# Patient Record
Sex: Male | Born: 1955 | Race: White | Hispanic: No | Marital: Married | Smoking: Never smoker
Health system: Southern US, Community
[De-identification: ages and names within clinical notes are randomized; demographics above are authoritative.]

## PROBLEM LIST (undated history)

## (undated) DIAGNOSIS — Z860100 Personal history of colon polyps, unspecified: Secondary | ICD-10-CM

## (undated) DIAGNOSIS — M1990 Unspecified osteoarthritis, unspecified site: Secondary | ICD-10-CM

## (undated) DIAGNOSIS — R0981 Nasal congestion: Secondary | ICD-10-CM

## (undated) DIAGNOSIS — IMO0002 Reserved for concepts with insufficient information to code with codable children: Secondary | ICD-10-CM

## (undated) DIAGNOSIS — M545 Low back pain, unspecified: Secondary | ICD-10-CM

## (undated) DIAGNOSIS — Z85828 Personal history of other malignant neoplasm of skin: Secondary | ICD-10-CM

## (undated) DIAGNOSIS — K5730 Diverticulosis of large intestine without perforation or abscess without bleeding: Secondary | ICD-10-CM

## (undated) DIAGNOSIS — C801 Malignant (primary) neoplasm, unspecified: Secondary | ICD-10-CM

## (undated) DIAGNOSIS — E785 Hyperlipidemia, unspecified: Secondary | ICD-10-CM

## (undated) DIAGNOSIS — Z8701 Personal history of pneumonia (recurrent): Secondary | ICD-10-CM

## (undated) HISTORY — DX: Personal history of colonic polyps: Z86.010

## (undated) HISTORY — DX: Low back pain: M54.5

## (undated) HISTORY — DX: Personal history of pneumonia (recurrent): Z87.01

## (undated) HISTORY — PX: COLONOSCOPY: SHX174

## (undated) HISTORY — PX: OTHER SURGICAL HISTORY: SHX169

## (undated) HISTORY — DX: Hyperlipidemia, unspecified: E78.5

## (undated) HISTORY — DX: Malignant (primary) neoplasm, unspecified: C80.1

## (undated) HISTORY — DX: Unspecified osteoarthritis, unspecified site: M19.90

## (undated) HISTORY — DX: Nasal congestion: R09.81

## (undated) HISTORY — DX: Personal history of other malignant neoplasm of skin: Z85.828

## (undated) HISTORY — PX: CYST EXCISION: SHX5701

## (undated) HISTORY — DX: Low back pain, unspecified: M54.50

## (undated) HISTORY — DX: Personal history of colon polyps, unspecified: Z86.0100

## (undated) HISTORY — DX: Reserved for concepts with insufficient information to code with codable children: IMO0002

## (undated) HISTORY — DX: Diverticulosis of large intestine without perforation or abscess without bleeding: K57.30

---

## 2004-02-06 ENCOUNTER — Ambulatory Visit: Payer: Self-pay | Admitting: Pulmonary Disease

## 2004-02-20 ENCOUNTER — Ambulatory Visit: Payer: Self-pay | Admitting: Pulmonary Disease

## 2005-05-23 ENCOUNTER — Ambulatory Visit: Payer: Self-pay | Admitting: Pulmonary Disease

## 2005-10-21 ENCOUNTER — Ambulatory Visit: Payer: Self-pay | Admitting: Pulmonary Disease

## 2005-10-21 LAB — CONVERTED CEMR LAB
Cholesterol: 218 mg/dL (ref 0–200)
HDL: 30.9 mg/dL — ABNORMAL LOW (ref >39.0)
LDL DIRECT: 167.1 mg/dL
Triglyceride fasting, serum: 117 mg/dL (ref 0–149)

## 2006-06-21 ENCOUNTER — Ambulatory Visit: Payer: Self-pay | Admitting: Pulmonary Disease

## 2006-11-22 ENCOUNTER — Ambulatory Visit: Payer: Self-pay | Admitting: Pulmonary Disease

## 2006-11-22 LAB — CONVERTED CEMR LAB
Albumin: 4.2 g/dL (ref 3.5–5.2)
Alkaline Phosphatase: 51 units/L (ref 39–117)
LDL Cholesterol: 99 mg/dL (ref 0–99)
Total CHOL/HDL Ratio: 4.8
Total Protein: 7.4 g/dL (ref 6.0–8.3)

## 2006-12-21 ENCOUNTER — Telehealth (INDEPENDENT_AMBULATORY_CARE_PROVIDER_SITE_OTHER): Payer: Self-pay | Admitting: *Deleted

## 2007-01-02 ENCOUNTER — Encounter: Payer: Self-pay | Admitting: Pulmonary Disease

## 2007-09-19 ENCOUNTER — Telehealth (INDEPENDENT_AMBULATORY_CARE_PROVIDER_SITE_OTHER): Payer: Self-pay | Admitting: *Deleted

## 2007-11-21 ENCOUNTER — Ambulatory Visit: Payer: Self-pay | Admitting: Pulmonary Disease

## 2007-11-21 DIAGNOSIS — E7800 Pure hypercholesterolemia, unspecified: Secondary | ICD-10-CM

## 2007-11-21 DIAGNOSIS — J189 Pneumonia, unspecified organism: Secondary | ICD-10-CM

## 2007-11-21 DIAGNOSIS — M545 Low back pain: Secondary | ICD-10-CM

## 2007-11-21 DIAGNOSIS — M1990 Unspecified osteoarthritis, unspecified site: Secondary | ICD-10-CM | POA: Insufficient documentation

## 2007-11-21 DIAGNOSIS — Z85828 Personal history of other malignant neoplasm of skin: Secondary | ICD-10-CM | POA: Insufficient documentation

## 2007-11-22 ENCOUNTER — Ambulatory Visit: Payer: Self-pay | Admitting: Pulmonary Disease

## 2007-11-23 LAB — CONVERTED CEMR LAB
ALT: 35 units/L (ref 0–53)
Albumin: 4.1 g/dL (ref 3.5–5.2)
BUN: 14 mg/dL (ref 6–23)
Basophils Relative: 0.4 % (ref 0.0–3.0)
Bilirubin, Direct: 0.1 mg/dL (ref 0.0–0.3)
CO2: 31 meq/L (ref 19–32)
Calcium: 9.3 mg/dL (ref 8.4–10.5)
Eosinophils Relative: 4.3 % (ref 0.0–5.0)
GFR calc Af Amer: 132 mL/min
Glucose, Bld: 102 mg/dL — ABNORMAL HIGH (ref 70–99)
HDL: 31.8 mg/dL — ABNORMAL LOW (ref >39.0)
Hemoglobin: 15.6 g/dL (ref 13.0–17.0)
LDL Cholesterol: 97 mg/dL (ref 0–99)
Lymphocytes Relative: 27.7 % (ref 12.0–46.0)
Monocytes Absolute: 0.6 10*3/uL (ref 0.1–1.0)
Monocytes Relative: 7.8 % (ref 3.0–12.0)
Neutro Abs: 4.4 10*3/uL (ref 1.4–7.7)
Total CHOL/HDL Ratio: 4.6
Total Protein: 6.9 g/dL (ref 6.0–8.3)
Triglycerides: 91 mg/dL (ref 0–149)

## 2008-10-08 ENCOUNTER — Telehealth: Payer: Self-pay | Admitting: Pulmonary Disease

## 2008-10-21 ENCOUNTER — Ambulatory Visit: Payer: Self-pay | Admitting: Gastroenterology

## 2008-11-12 ENCOUNTER — Ambulatory Visit: Payer: Self-pay | Admitting: Gastroenterology

## 2008-11-12 ENCOUNTER — Encounter: Payer: Self-pay | Admitting: Gastroenterology

## 2008-11-12 ENCOUNTER — Encounter: Payer: Self-pay | Admitting: Pulmonary Disease

## 2008-11-14 ENCOUNTER — Encounter: Payer: Self-pay | Admitting: Gastroenterology

## 2009-01-16 ENCOUNTER — Ambulatory Visit: Payer: Self-pay | Admitting: Pulmonary Disease

## 2009-01-16 DIAGNOSIS — D179 Benign lipomatous neoplasm, unspecified: Secondary | ICD-10-CM | POA: Insufficient documentation

## 2009-01-16 LAB — CONVERTED CEMR LAB
ALT: 43 units/L (ref 0–53)
AST: 29 units/L (ref 0–37)
Alkaline Phosphatase: 58 units/L (ref 39–117)
BUN: 9 mg/dL (ref 6–23)
Basophils Relative: 1.1 % (ref 0.0–3.0)
Calcium: 9.6 mg/dL (ref 8.4–10.5)
Cholesterol: 209 mg/dL — ABNORMAL HIGH (ref 0–200)
Direct LDL: 151.4 mg/dL
Eosinophils Absolute: 0.3 10*3/uL (ref 0.0–0.7)
Eosinophils Relative: 5.2 % — ABNORMAL HIGH (ref 0.0–5.0)
GFR calc non Af Amer: 108.28 mL/min (ref >60)
Glucose, Bld: 100 mg/dL — ABNORMAL HIGH (ref 70–99)
HDL: 41.6 mg/dL (ref >39.00)
Lymphocytes Relative: 31.1 % (ref 12.0–46.0)
MCHC: 33.1 g/dL (ref 30.0–36.0)
Monocytes Relative: 10.2 % (ref 3.0–12.0)
Neutro Abs: 3.1 10*3/uL (ref 1.4–7.7)
Neutrophils Relative %: 52.4 % (ref 43.0–77.0)
Potassium: 4.2 meq/L (ref 3.5–5.1)
Sed Rate: 8 mm/hr (ref 0–22)
Total Protein: 7.1 g/dL (ref 6.0–8.3)
VLDL: 22.4 mg/dL (ref 0.0–40.0)

## 2009-01-21 ENCOUNTER — Encounter: Payer: Self-pay | Admitting: Pulmonary Disease

## 2009-01-21 ENCOUNTER — Encounter: Admission: RE | Admit: 2009-01-21 | Discharge: 2009-01-21 | Payer: Self-pay | Admitting: General Surgery

## 2009-01-28 ENCOUNTER — Encounter: Payer: Self-pay | Admitting: Pulmonary Disease

## 2009-02-02 ENCOUNTER — Telehealth: Payer: Self-pay | Admitting: Pulmonary Disease

## 2009-05-25 ENCOUNTER — Telehealth: Payer: Self-pay | Admitting: Pulmonary Disease

## 2009-08-28 ENCOUNTER — Telehealth (INDEPENDENT_AMBULATORY_CARE_PROVIDER_SITE_OTHER): Payer: Self-pay | Admitting: *Deleted

## 2009-11-09 ENCOUNTER — Ambulatory Visit: Payer: Self-pay | Admitting: Pulmonary Disease

## 2009-11-09 ENCOUNTER — Telehealth: Payer: Self-pay | Admitting: Pulmonary Disease

## 2009-11-14 DIAGNOSIS — D126 Benign neoplasm of colon, unspecified: Secondary | ICD-10-CM | POA: Insufficient documentation

## 2009-11-14 DIAGNOSIS — K5730 Diverticulosis of large intestine without perforation or abscess without bleeding: Secondary | ICD-10-CM | POA: Insufficient documentation

## 2010-01-22 IMAGING — CR DG FEMUR 2V*L*
4 series · 4 of 4 positions shown · non-contrast
Comparison: None

CLINICAL DATA: Palpable mass left upper thigh.

LEFT FEMUR - 2 VIEW

[view not recorded (1 of 4)]
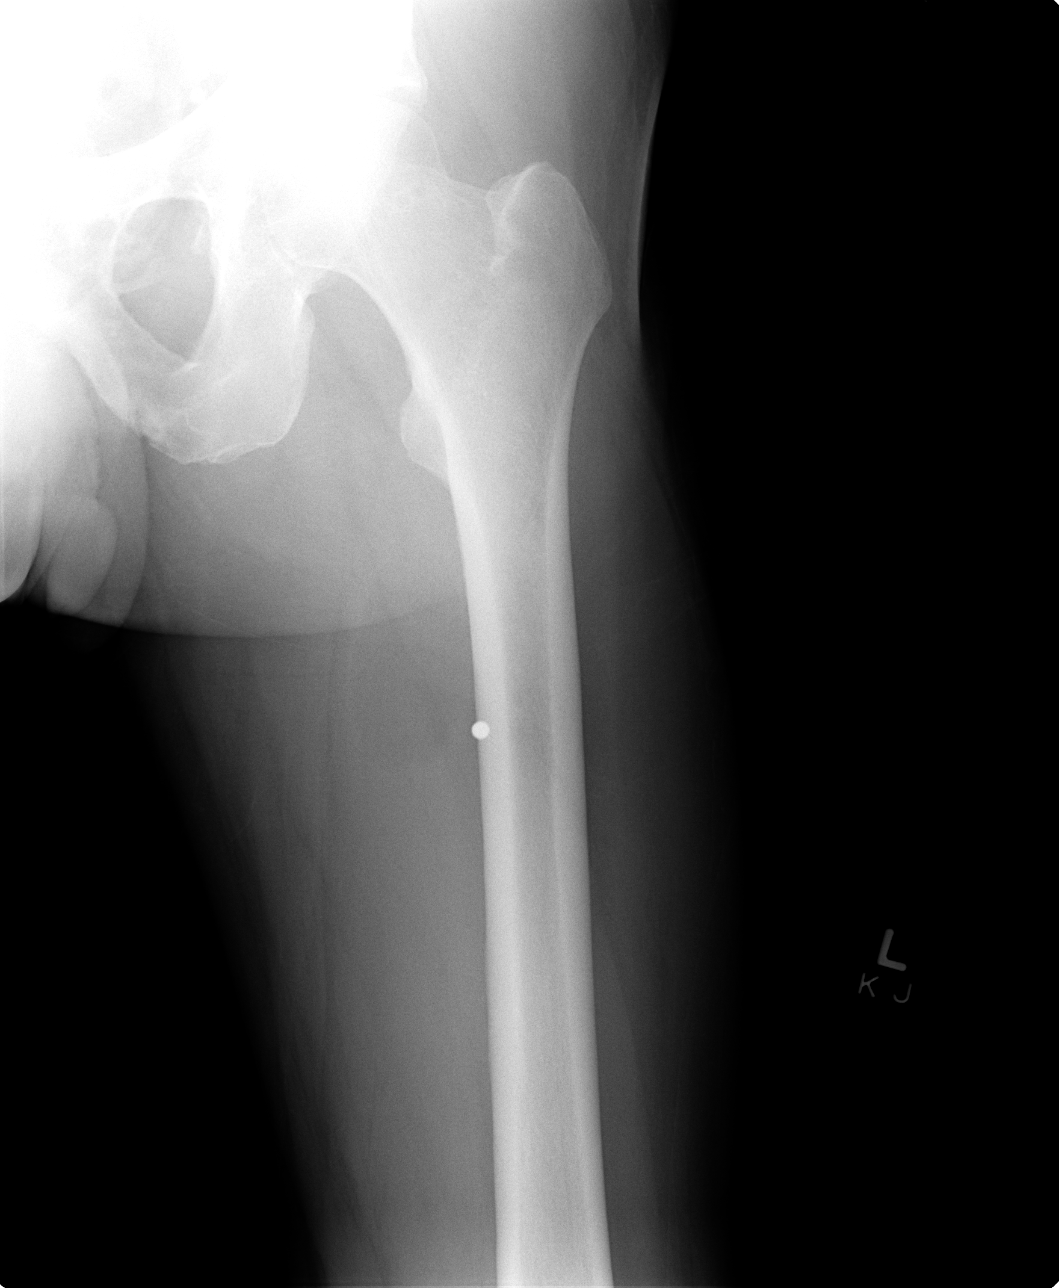

[view not recorded (2 of 4)]
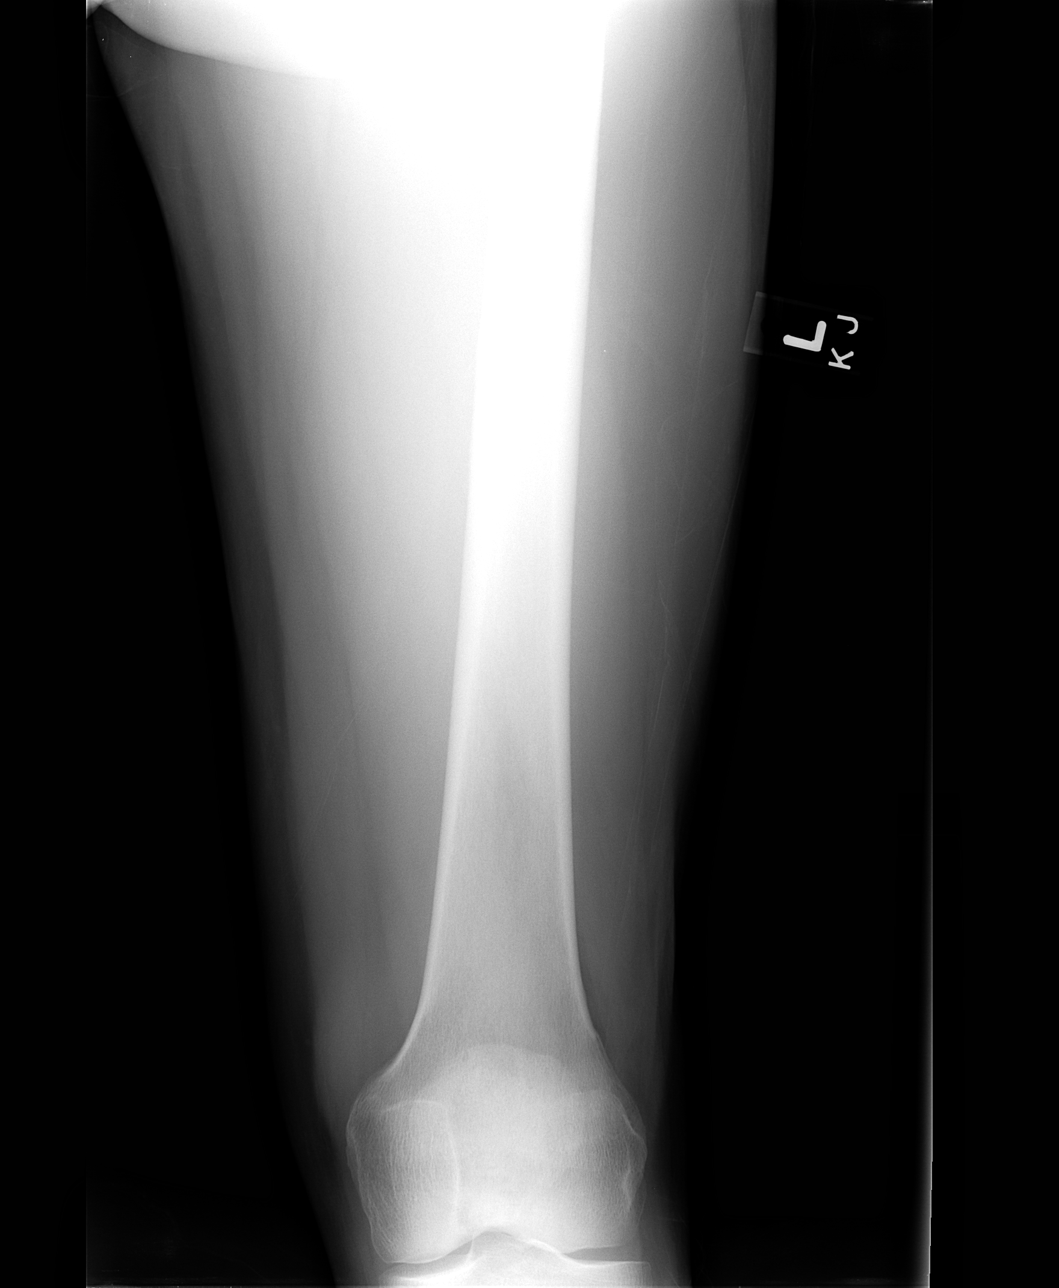

[view not recorded (3 of 4)]
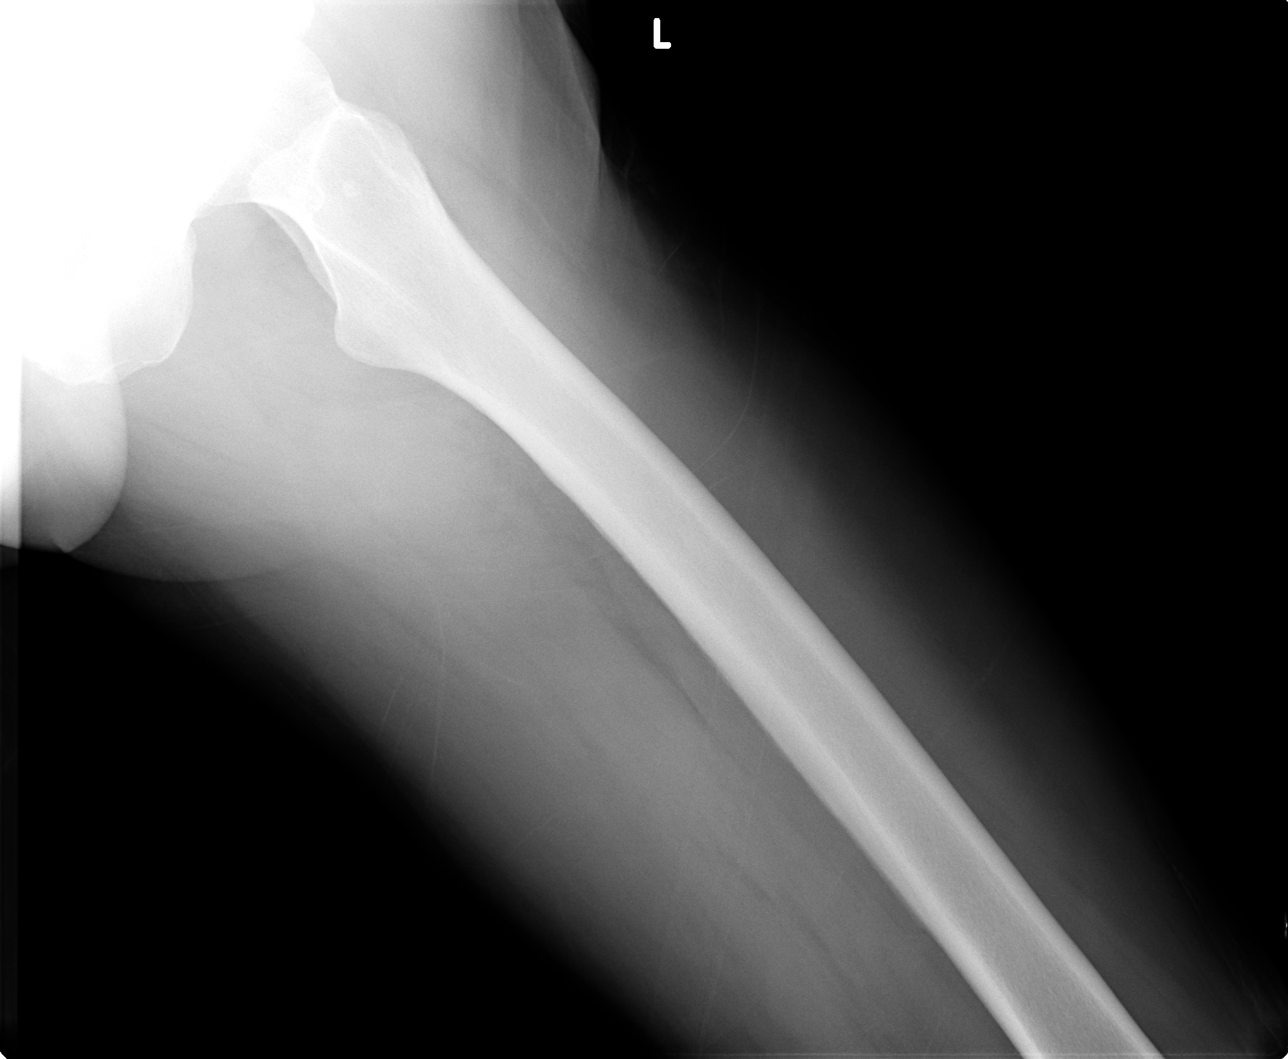

[view not recorded (4 of 4)]
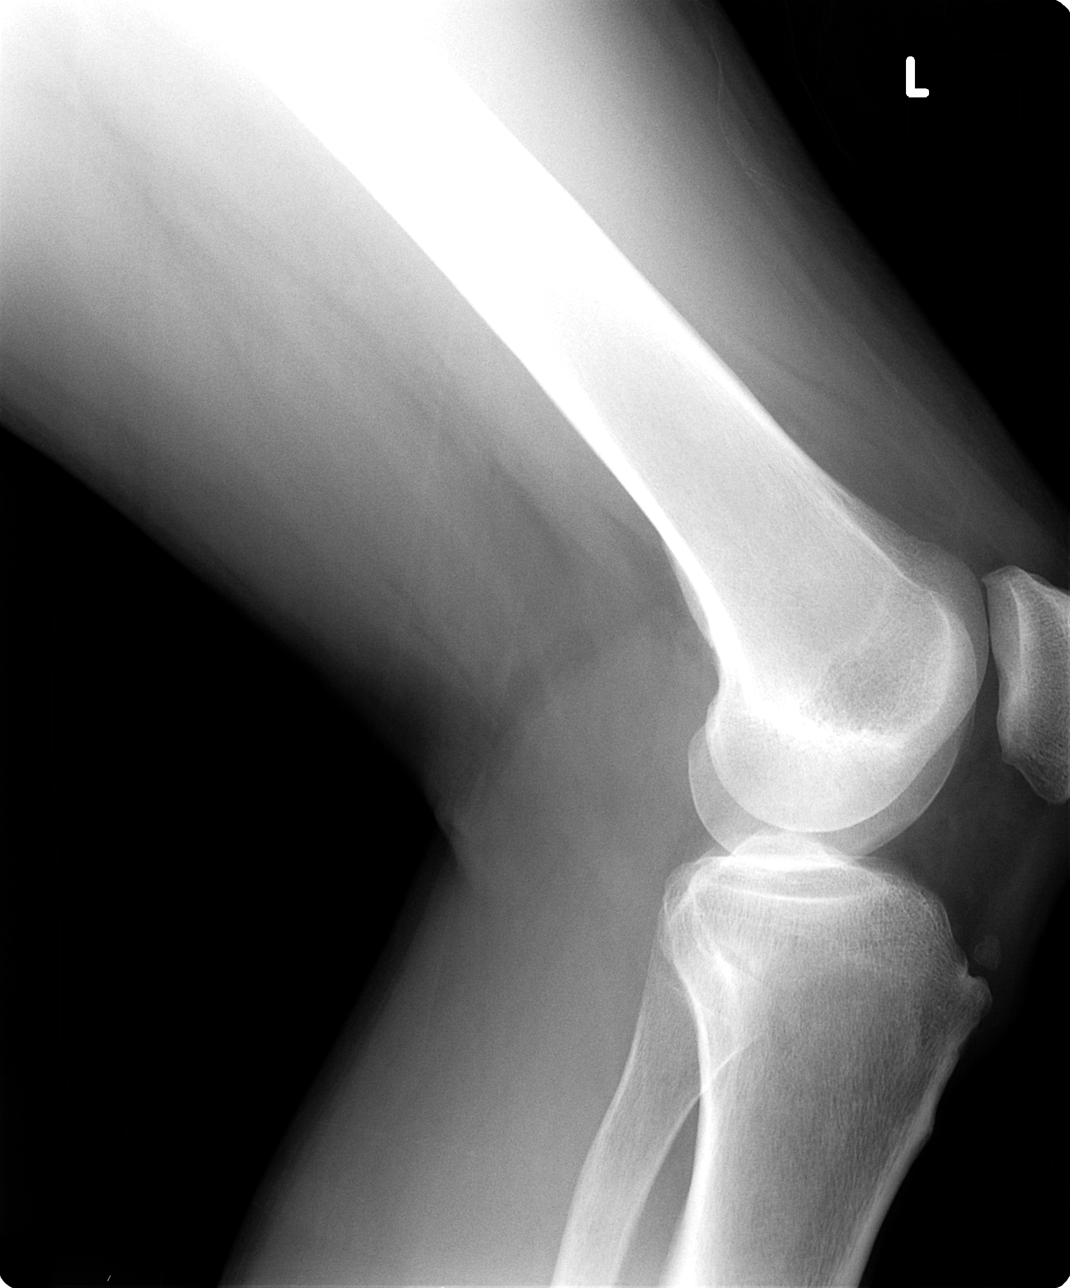

[4 of 4 positions shown; findings below may reference images not displayed]

FINDINGS: A BB was placed in the area of palpable soft tissue
abnormality.  No abnormality seen.  Bony structures unremarkable.
Soft tissues appear unremarkable by plain radiographs.
IMPRESSION: No acute bony abnormality.

## 2010-02-09 NOTE — Progress Notes (Signed)
Summary: prescript  Phone Note Call from Patient Call back at [phone]   Caller: Patient Call For: nadel Summary of Call: pt need 3 month supply for cholestrol med sent to [facility] Initial call taken by: Rickard Patience,  August 28, 2009 2:17 PM  Follow-up for Phone Call        Pt is scheduling appt with SN and is aware that refills are through [facility]-filled back in May 2011.Reynaldo Minium CMA  August 28, 2009 2:27 PM

## 2010-02-09 NOTE — Assessment & Plan Note (Signed)
Summary: rov per Katie/apc   CC:  2 year ROV & review of mult medical problems> requests 90d refills....  History of Present Illness: 55 y/o WM here for a follow up visit...  he has multiple medical problems as noted below...     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 he had routine colonoscopy 11/10 by DrJacobs w/ mild divertics & tubular adenoma removed- f/u planned 56yrs...  no new complaints or concerns, & he just wants his Simvastatin refilled today.   Current Problem List:  PHYSICAL EXAMINATION (ICD-V70.0) / Health Maintenance - good general health... 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  Hx of PNEUMONIA (ICD-486) - yrs ago, no known residual problems...  HYPERCHOLESTEROLEMIA (ICD-272.0) - on SIMVASTATIN 40mg /d,  Fish Oil, + diet...  ~  FLP 4/07 showed TChol 248, TG 132, HDL 48, LDL 174... rec to start Simva40...  ~  FLP 5/08 on Simva40 irregularly showed TChol 225, TG 206, HDL 43, LDL 141... rec- take daily...  ~  FLP 11/09 on Simva40 showed TChol 147, TG 91, HDL 32, LDL 97  ~  FLP 1/11 on Simva40 showed TChol 209, TG 112, HDL 42, LDL 151... reminded to take daily.  DIVERTICULOSIS OF COLON (ICD-562.10) COLONIC POLYPS (ICD-211.3) - routine colonosopy 11/10 by DrJacobs showed mild divertics & 2 polyps= tubular ademoma, f/u planned 80yrs...  DEGENERATIVE JOINT DISEASE (ICD-715.90) - hx prev knee arthroscopy...  Hx of BACK PAIN, LUMBAR (ICD-724.2) - he states "spurs" on prev eval...  SKIN CANCER, HX OF (ICD-V10.83) - he sees Dr Donzetta Starch annually for eval> superfic basal cell cancers removed from chest & leg 6/11...  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   Preventive  Screening-Counseling & Management  Alcohol-Tobacco     Smoking Status: never  Allergies (verified): No Known Drug Allergies  Comments:  Nurse/Medical Assistant: The patient's medications and allergies were reviewed with the patient and were updated in the Medication and Allergy Lists.  Past History:  Past Medical History: Hx of PNEUMONIA (ICD-486) HYPERCHOLESTEROLEMIA (ICD-272.0) DIVERTICULOSIS OF COLON (ICD-562.10) COLONIC POLYPS (ICD-211.3) DEGENERATIVE JOINT DISEASE (ICD-715.90) Hx of BACK PAIN, LUMBAR (ICD-724.2) SKIN CANCER, HX OF (ICD-V10.83) LIPOMA (ICD-214.9)  Past Surgical History: S/P knee arthroscopy  Family History: Reviewed history from 11/21/2007 and no changes required. Father alive age 82 w/ hx CAD and CABG Mother died age 75 w/ alcoholism & liver cancer 2 Siblings- 1 Bro w/ HBP, Chol;  ! Bro in good health  Social History: Reviewed history from 11/21/2007 and no changes required. 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    Vital Signs:  Patient profile:   55 year old male Height:      71 inches Weight:      187.50 pounds BMI:     26.25 O2 Sat:      97 % on Room air Temp:  97.9 degrees F oral Pulse rate:   72 / minute BP sitting:   132 / 70  (left arm) Cuff size:   regular  Vitals Entered By: Randell Loop CMA (November 09, 2009 2:11 PM)  O2 Sat at Rest %:  97 O2 Flow:  Room air CC: 2 year ROV & review of mult medical problems> requests 90d  refills... Is Patient Diabetic? No Pain Assessment Patient in pain? no      Comments meds updated today with pt   Physical Exam  Additional Exam:  WD, WN, 55 y/o WM in NAD... GENERAL:  Alert & oriented; pleasant & cooperative... HEENT:  [place]/AT, EOM-wnl, PERRLA, Fundi-benign, EACs-clear, TMs-wnl, NOSE-clear, THROAT-clear & wnl. NECK:  Supple w/ full ROM; no JVD; normal carotid impulses w/o bruits; no thyromegaly or nodules palpated; no lymphadenopathy. CHEST:  Clear to P & A; without wheezes/ rales/ or rhonchi. HEART:  Regular Rhythm; without murmurs/ rubs/ or gallops. ABDOMEN:  Soft & nontender; normal bowel sounds; no organomegaly or masses detected., groin no adenopathy, no bruits noted.  femoral pulses intact.  EXT: without deformities or arthritic changes; no varicose veins/ venous insuffic/ or edema. left mid to upper thigh w/ soft tissue mass  ~3 cm sl firm and nontender.  NEURO:  CN's intact; motor testing normal; sensory testing normal; gait normal & balance OK. DERM:  No lesions noted; no rash etc...    Impression & Recommendations:  Problem # 1:  HYPERCHOLESTEROLEMIA (ICD-272.0) Stable on Simva40... reminded to take daily & low chol diet... His updated medication list for this problem includes:    Simvastatin 40 Mg Tabs (Simvastatin) .Marland Kitchen... Take 1 by mouth once daily...  Problem # 2:  DEGENERATIVE JOINT DISEASE (ICD-715.90) No complaints>  he had prev left knee arthroscopy. His updated medication list for this problem includes:    Aspirin 81 Mg Tbec (Aspirin) .Marland Kitchen... Take 1 tab by mouth once daily...  Problem # 3:  COLONIC POLYPS (ICD-211.3) Routine colon w/ mild divertics and polyp> f/u planned 27yrs...  Problem # 4:  LIPOMA (ICD-214.9) He is planning excisional surg later when insurance covers...  Complete Medication List: 1)  Aspirin 81 Mg Tbec (Aspirin) .... Take 1 tab by mouth once daily.Marland KitchenMarland Kitchen 2)  Simvastatin 40 Mg Tabs (Simvastatin) .... Take 1 by mouth once  daily...  Patient Instructions: 1)  Today we updated your med list- see below.... 2)  We refilled your Simvstatin for a 90d supply as requested... 3)  Call for any problems.Marland KitchenMarland Kitchen 4)  Please schedule a follow-up appointment in 1 year, sooner as needed. Prescriptions: SIMVASTATIN 40 MG  TABS (SIMVASTATIN) take 1 by mouth once daily...  #90 x 4   Entered and Authorized by:   Michele Mcalpine MD   Signed by:   Michele Mcalpine MD on 11/09/2009   Method used:   Print then Give to Patient   RxID:   [id number]

## 2010-02-09 NOTE — Progress Notes (Signed)
Summary: med refills  Phone Note Call from Patient   Caller: Patient Call For: Seyed Heffley Summary of Call: pt has questions re: new ins- [employer] and having meds refilled through [employer]. pt # [id number] Initial call taken by: Tivis Ringer, CNA,  May 25, 2009 9:56 AM  Follow-up for Phone Call        pt has switched insurance companies and now has to use [facility] for rx. pt request refill for simvastatin be sent to [facility]. rx sent. ptaware. Carron Curie CMA  May 25, 2009 10:08 AM     Prescriptions: SIMVASTATIN 40 MG  TABS (SIMVASTATIN) take 1 by mouth once daily...  #90 x 3   Entered by:   Carron Curie CMA   Authorized by:   Michele Mcalpine MD   Signed by:   Carron Curie CMA on 05/25/2009   Method used:   Faxed to ...       [facility]* (mail-order)             ,          Ph: [phone]       Fax: [phone]   RxID:   [id number]

## 2010-02-09 NOTE — Letter (Signed)
Summary: [facility]   Imported By: Sherian Rein 02/28/2009 08:25:57  _____________________________________________________________________  External Attachment:    Type:   Image     Comment:   External Document

## 2010-02-09 NOTE — Consult Note (Signed)
Summary: [facility]   Imported By: Sherian Rein 02/20/2009 12:51:34  _____________________________________________________________________  External Attachment:    Type:   Image     Comment:   External Document

## 2010-02-09 NOTE — Assessment & Plan Note (Signed)
Summary: Acute NP office visit - knot on thigh   CC:  knot on top of left thigh toward the groin area approx the size of a silver dollar x58months - denies redness/pain/increase in size.  History of Present Illness: 55  y/o WM with known history of Hyperlipidemia and DJD  January 16, 2009--Presents for knot on top of left thigh toward the groin area approx the size of a silver dollar x65months - denies redness/pain/increase in size or known injury. this is not painful, no redness. Does not think this has grown in size. His wife is concerned this has not resolved. Also dry nonpruritic rash along back of legs this started 1 month ago. Has had in past used cream for dry skin. Worse than usual. Denies chest pain, dyspnea, orthopnea, hemoptysis, fever, n/v/d, edema, headache.   Medications Prior to Update: 1)  Aspirin 81 Mg Tbec (Aspirin) .... Take 1 Tab By Mouth Once Daily.Marland KitchenMarland Kitchen 2)  Simvastatin 40 Mg  Tabs (Simvastatin) .... Take 1 By Mouth Once Daily...  Current Medications (verified): 1)  Aspirin 81 Mg Tbec (Aspirin) .... Take 1 Tab By Mouth Once Daily.Marland KitchenMarland Kitchen 2)  Simvastatin 40 Mg  Tabs (Simvastatin) .... Take 1 By Mouth Once Daily...  Allergies (verified): No Known Drug Allergies  Past History:  Past Surgical History: Last updated: 11/21/2007 S/P knee arthroscopy  Family History: Last updated: 11/21/2007 Father alive age 32 w/ hx CAD and CABG Mother died age 67 w/ alcoholism & liver cancer 2 Siblings- 1 Bro w/ HBP, Chol;  ! 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  Hx of PNEUMONIA (ICD-486) - yrs ago, no known residual problems...  HYPERCHOLESTEROLEMIA (ICD-272.0) - on SIMVASTATIN 40mg /d,  Fish Oil, + diet...  ~  FLP 4/07 showed TChol 248, TG 132, HDL 48, LDL 174... rec to start Simva40...  ~  FLP 5/08 on Simva irregularly showed TChol 225, TG 206, HDL 43, LDL 141... rec- take daily...    DEGENERATIVE JOINT DISEASE (ICD-715.90) - hx prev knee arthroscopy...  Hx of BACK PAIN, LUMBAR (ICD-724.2) - he states "spurs" on prev eval...  SKIN CANCER, HX OF (ICD-V10.83) - he sees dr Donzetta Starch annually for eval... several pre-cancerous lesions removed...     Review of Systems      See HPI  Vital Signs:  Patient profile:   55 year old male Height:      71 inches Weight:      191 pounds BMI:     26.74 O2 Sat:      99 % on Room air Temp:     97.5 degrees F oral Pulse rate:   71 / minute BP sitting:   128 / 84  (right arm) Cuff size:   regular  Vitals Entered By: Boone Master CNA (January 16, 2009 9:01 AM)  O2 Flow:  Room air CC: knot on top of left thigh toward the groin area approx the size of a silver dollar x67months - denies redness/pain/increase in size Is Patient Diabetic? No Comments Medications reviewed with patient Daytime contact number verified with patient. Boone Master CNA  January 16, 2009 9:01 AM    Physical Exam  Additional Exam:  WD, WN, 55 y/o WM in NAD... GENERAL:  Alert & oriented; pleasant & cooperative... HEENT:  [place]/AT, EOM-wnl, PERRLA, Fundi-benign, EACs-clear, TMs-wnl, NOSE-clear, THROAT-clear & wnl. NECK:  Supple w/ full ROM; no JVD; normal carotid impulses w/o bruits; no thyromegaly or nodules palpated; no lymphadenopathy. CHEST:  Clear to P & A; without wheezes/ rales/ or rhonchi. HEART:  Regular Rhythm; without murmurs/ rubs/ or gallops. ABDOMEN:  Soft & nontender; normal bowel sounds; no organomegaly or masses detected., groin no adenopathy, no bruits noted.  femoral pulses intact.  EXT: without deformities or arthritic changes; no varicose veins/ venous insuffic/ or edema. left mid to upper thigh w/ soft tissue mass  ~3 cm somewhat firm and fixed nontender.   NEURO:  CN's intact; motor testing normal; sensory testing normal; gait normal & balance OK. DERM:  No lesions noted; no rash etc...     Impression & Recommendations:  Problem # 1:  LIPOMA (ICD-214.9) Mid left thigh soft tissue mass ? etiology  does appear similar to lipoma however is somewhat and fixed to quad muscle.  no adenopathy noted.  Discussed case with Dr. Kriste Basque and pt to be referred to surgeon for evaluation      Problem # 2:  HYPERCHOLESTEROLEMIA (ICD-272.0) fasting today labs pending.  (has missed 3 days of meds)  His updated medication list for this problem includes:    Simvastatin 40 Mg Tabs (Simvastatin) .Marland Kitchen... Take 1 by mouth once daily...  Orders: TLB-Lipid Panel (80061-LIPID) TLB-Hepatic/Liver Function Pnl (80076-HEPATIC) TLB-BMP (Basic Metabolic Panel-BMET) (80048-METABOL) TLB-TSH (Thyroid Stimulating Hormone) (84443-TSH) Est. Patient Level IV (16109)  Complete Medication List: 1)  Aspirin 81 Mg Tbec (Aspirin) .... Take 1 tab by mouth once daily.Marland KitchenMarland Kitchen 2)  Simvastatin 40 Mg Tabs (Simvastatin) .... Take 1 by mouth once daily...  Other Orders: TLB-CBC Platelet - w/Differential (85025-CBCD) TLB-Sedimentation Rate (ESR) (85652-ESR) T-Femur Left 2 views (60454UJ) Surgical Referral (Surgery)  Patient Instructions: 1)  Low fat cholestrol diet 2)  I will call with lab results.  3)  Lipoma is a soft fatty mass in the skin.  4)  Topicort Cream to rash two times a day for 7 days  5)  follow up 2-3 weeks Dr. Kriste Basque  6)  Please contact office for sooner follow up if symptoms do not improve or worsen    Immunization History:  Influenza Immunization History:    Influenza:  historical (10/10/2001)  Pneumovax Immunization History:    Pneumovax:  historical (10/11/1998)

## 2010-02-09 NOTE — Progress Notes (Signed)
Summary: simvastin rx -  Phone Note Call from Patient Call back at Home Phone [phone]   Caller: Patient Call For: nadel Summary of Call: pt wants simvastatin to be called in to [facility].  Initial call taken by: Tivis Ringer, CNA,  November 09, 2009 3:23 PM  Follow-up for Phone Call        [facility] TCB x1.  pt was seen earlier this afternoon.  pt was given rx for the simvastatin at ov.   Boone Master CNA/MA  November 09, 2009 4:17 PM   Returning phone call.Darletta Moll  November 10, 2009 12:57 PM  ATC line busy. WCBx1. Carron Curie CMA  November 10, 2009 1:55 PM    Additional Follow-up for Phone Call Additional follow up Details #1::        pt wants [place] to send in rx for simvastin. rx sent.Carron Curie CMA  November 10, 2009 4:11 PM     Prescriptions: SIMVASTATIN 40 MG  TABS (SIMVASTATIN) take 1 by mouth once daily...  #90 x 4   Entered by:   Carron Curie CMA   Authorized by:   Michele Mcalpine MD   Signed by:   Carron Curie CMA on 11/10/2009   Method used:   Faxed to ...       [facility]* (retail)             ,          Ph: [phone]       Fax: [phone]   RxID:   [id number] SIMVASTATIN 40 MG  TABS (SIMVASTATIN) take 1 by mouth once daily...  #90 x 4   Entered by:   Carron Curie CMA   Authorized by:   Michele Mcalpine MD   Signed by:   Carron Curie CMA on 11/10/2009   Method used:   Electronically to        [facility]* (retail)             ,          Ph: [phone]       Fax: [phone]   RxID:   [id number]

## 2010-02-09 NOTE — Progress Notes (Signed)
Summary: questions  Phone Note Call from Patient Call back at [phone]   Caller: Patient Call For: Ytzel Gubler Summary of Call: Was referred to [facility] for evaluation of lump on leg. Have questions in reference to this. Initial call taken by: Darletta Moll,  February 02, 2009 2:34 PM  Follow-up for Phone Call        Pt states that [place] surgery is not in network for his insurance. I advised the pt to call his insurance to check to see who was covered. He wanted to know who SN would rec beside [facility]. please advise. Carron Curie CMA  February 02, 2009 3:14 PM   Additional Follow-up for Phone Call Additional follow up Details #1::        all general surgery in [place] are in [facility]----see if they allow [facility]  Dr. Elesa Massed, ortho---thanks Randell Loop CMA  February 02, 2009 4:07 PM   pt advised to try [facility] Dr. ward. he will call if he needs a referral from [place]. he is going to call [facility] first for referral. Carron Curie CMA  February 02, 2009 4:11 PM

## 2010-05-07 ENCOUNTER — Other Ambulatory Visit: Payer: Self-pay | Admitting: Pulmonary Disease

## 2010-09-22 ENCOUNTER — Other Ambulatory Visit: Payer: Self-pay | Admitting: *Deleted

## 2010-09-22 MED ORDER — SIMVASTATIN 40 MG PO TABS: 40.0000 mg | ORAL_TABLET | Freq: Every day | ORAL | Status: DC

## 2010-11-08 ENCOUNTER — Ambulatory Visit: Payer: Self-pay | Admitting: Pulmonary Disease

## 2010-12-20 ENCOUNTER — Ambulatory Visit: Payer: Self-pay | Admitting: Pulmonary Disease

## 2011-02-07 ENCOUNTER — Other Ambulatory Visit: Payer: Self-pay | Admitting: Dermatology

## 2011-02-15 ENCOUNTER — Encounter (INDEPENDENT_AMBULATORY_CARE_PROVIDER_SITE_OTHER): Payer: Self-pay | Admitting: General Surgery

## 2011-02-15 ENCOUNTER — Ambulatory Visit (INDEPENDENT_AMBULATORY_CARE_PROVIDER_SITE_OTHER): Payer: Managed Care, Other (non HMO) | Admitting: General Surgery

## 2011-02-15 VITALS — BP 122/64 | HR 68 | Temp 97.6°F | Resp 18 | Ht 70.0 in | Wt 170.2 lb

## 2011-02-15 DIAGNOSIS — D2370 Other benign neoplasm of skin of unspecified lower limb, including hip: Secondary | ICD-10-CM | POA: Insufficient documentation

## 2011-02-15 NOTE — Progress Notes (Signed)
Patient ID: Roy Wyatt, male   DOB: January 07, 1956, 56 y.o.   MRN: [id number]  Chief Complaint  Patient presents with  . Wound Check    Cyst on upper left leg    HPI Roy Wyatt is a 56 y.o. male.   HPI  He is an established patient who I saw back in 2011. At that time he was diagnosed with an intramuscular lipoma in the left thigh. We talked about removing it but he did not want to have it done at that time. He thinks now that it is getting larger and he returns to talk about excision.  He denies pain in the area. He denies paresthesias or limitation of range of motion.  Past Medical History  Diagnosis Date  . Cyst     left leg  . Arthritis   . Hyperlipidemia   . Nasal congestion     Past Surgical History  Procedure Date  . Cartilage repair 2000 - approx    knee    Family History  Problem Relation Age of Onset  . Cancer Mother     liver, skin  . Cancer Father     skin    Social History History  Substance Use Topics  . Smoking status: Never Smoker   . Smokeless tobacco: Never Used  . Alcohol Use: Yes     3 - 4 per week    No Known Allergies  Current Outpatient Prescriptions  Medication Sig Dispense Refill  . simvastatin (ZOCOR) 40 MG tablet Take 1 tablet (40 mg total) by mouth at bedtime.  90 tablet  0    Review of Systems Review of Systems  Constitutional: Negative for fever and chills.  HENT: Negative for congestion.   Respiratory: Negative.   Cardiovascular: Negative.   Musculoskeletal: Positive for arthralgias.    Blood pressure 122/64, pulse 68, temperature 97.6 F (36.4 C), temperature source Temporal, resp. rate 18, height 5\' 10"  (1.778 m), weight 170 lb 3.2 oz (77.202 kg).  Physical Exam Physical Exam  Constitutional: He appears well-developed and well-nourished. No distress.  Musculoskeletal:       6.5 x 5.5 cm palpable soft tissue mass left anterior thigh area    Data Reviewed  Old notes.  Assessment    Enlarging intramuscular left  thigh soft tissue mass; lipoma by CT criteria    Plan    Excision of mass. Procedure and risks including but not limited to bleeding, infection, wound healing problems, permanent weakness were discussed with him.       Ethelyne Erich J 02/15/2011, 4:34 PM

## 2011-02-15 NOTE — Patient Instructions (Signed)
Stop your Aspirin 5 days before your operation.

## 2011-04-13 ENCOUNTER — Other Ambulatory Visit: Payer: Self-pay | Admitting: Pulmonary Disease

## 2011-04-13 MED ORDER — SIMVASTATIN 40 MG PO TABS: 40.0000 mg | ORAL_TABLET | Freq: Every day | ORAL | Status: DC

## 2011-06-20 ENCOUNTER — Ambulatory Visit: Payer: Self-pay | Admitting: Pulmonary Disease

## 2011-10-25 ENCOUNTER — Encounter (INDEPENDENT_AMBULATORY_CARE_PROVIDER_SITE_OTHER): Payer: Managed Care, Other (non HMO) | Admitting: General Surgery

## 2011-11-07 ENCOUNTER — Ambulatory Visit (INDEPENDENT_AMBULATORY_CARE_PROVIDER_SITE_OTHER): Payer: Managed Care, Other (non HMO) | Admitting: General Surgery

## 2011-11-07 ENCOUNTER — Encounter (INDEPENDENT_AMBULATORY_CARE_PROVIDER_SITE_OTHER): Payer: Self-pay | Admitting: General Surgery

## 2011-11-07 VITALS — BP 120/80 | HR 80 | Temp 97.5°F | Resp 18 | Ht 70.5 in | Wt 175.8 lb

## 2011-11-07 DIAGNOSIS — D179 Benign lipomatous neoplasm, unspecified: Secondary | ICD-10-CM

## 2011-11-07 NOTE — Patient Instructions (Signed)
Please stop aspirin 5 days before the operation.

## 2011-11-07 NOTE — Progress Notes (Signed)
Patient ID: Roy Wyatt, male   DOB: 08/17/1955, 56 y.o.   MRN: [id number]  No chief complaint on file.   HPI Roy Wyatt is a 56 y.o. male.   HPI  He returns to discuss and schedule removal of the left thigh intramuscular lipoma. The last time I saw him was February of this year. He thinks it is getting a little larger.  Past Medical History  Diagnosis Date  . Cyst     left leg  . Arthritis   . Hyperlipidemia   . Nasal congestion   . History of pneumonia   . Diverticulosis of colon   . History of colonic polyps   . DJD (degenerative joint disease)   . Lumbar back pain   . History of skin cancer     Past Surgical History  Procedure Date  . Cartilage repair 2000 - approx    knee    Family History  Problem Relation Age of Onset  . Cancer Mother     liver, skin  . Cancer Father     skin    Social History History  Substance Use Topics  . Smoking status: Never Smoker   . Smokeless tobacco: Never Used  . Alcohol Use: Yes     3 - 4 per week    No Known Allergies  Current Outpatient Prescriptions  Medication Sig Dispense Refill  . aspirin 81 MG tablet Take 81 mg by mouth daily.      . simvastatin (ZOCOR) 40 MG tablet Take 1 tablet (40 mg total) by mouth at bedtime.  90 tablet  0    Review of Systems Review of Systems  Constitutional: Negative.     Blood pressure 120/80, pulse 80, temperature 97.5 F (36.4 C), temperature source Temporal, resp. rate 18, height 5' 10.5" (1.791 m), weight 175 lb 12.8 oz (79.742 kg).  Physical Exam Physical Exam  Constitutional: He appears well-developed and well-nourished. No distress.  HENT:  Head: Normocephalic and atraumatic.  Musculoskeletal:       7 cm x 5.5 cm left anterior thigh soft tissue mass that is firm but somewhat mobile.    Data Reviewed Old note.  Assessment    Enlarging left thigh intramuscular lipoma involving the left rectus femoris muscle by CT scan done in 2011.    Plan    Removal of the  left thigh lipoma under general anesthesia. The procedure and risks have been discussed with him. Risks include but are not limited to bleeding, infection, nerve injury, wound healing problems, anesthesia. We also discussed aftercare. He seems to understand this and would like to proceed.       Emara Lichter J 11/07/2011, 10:17 AM

## 2011-11-25 ENCOUNTER — Other Ambulatory Visit (INDEPENDENT_AMBULATORY_CARE_PROVIDER_SITE_OTHER): Payer: Self-pay | Admitting: General Surgery

## 2011-11-25 DIAGNOSIS — D1739 Benign lipomatous neoplasm of skin and subcutaneous tissue of other sites: Secondary | ICD-10-CM

## 2011-12-02 ENCOUNTER — Telehealth (INDEPENDENT_AMBULATORY_CARE_PROVIDER_SITE_OTHER): Payer: Self-pay

## 2011-12-02 NOTE — Telephone Encounter (Signed)
Documentation on wrong patient

## 2011-12-02 NOTE — Telephone Encounter (Signed)
Pt notified of appt 12/05/11 at 4:15p with Dr. Donell Beers.

## 2011-12-02 NOTE — Telephone Encounter (Signed)
Pt made aware of benign tissue path results.

## 2011-12-13 ENCOUNTER — Encounter (INDEPENDENT_AMBULATORY_CARE_PROVIDER_SITE_OTHER): Payer: Managed Care, Other (non HMO) | Admitting: General Surgery

## 2011-12-14 ENCOUNTER — Ambulatory Visit (INDEPENDENT_AMBULATORY_CARE_PROVIDER_SITE_OTHER): Payer: Managed Care, Other (non HMO) | Admitting: General Surgery

## 2011-12-14 DIAGNOSIS — Z9889 Other specified postprocedural states: Secondary | ICD-10-CM

## 2011-12-14 NOTE — Patient Instructions (Signed)
Resume normal activities as tolerated as discussed.

## 2011-12-14 NOTE — Progress Notes (Signed)
Procedure:  Excision of left quadriceps intramuscular lipoma  Date:11/25/2011  Pathology:Mature lipoma  History:He is here for his first postoperative visit and has no complaints.  Exam: General- Is in NAD. Left lower extremity-incision is clean, dry, and intact.  Assessment:  Doing well postoperatively. A copy of his pathology was given to him.  Plan:  Resume normal activities as tolerated. We discussed the fact that there was a very small chance of recurrence. Return visit when necessary.

## 2012-07-06 ENCOUNTER — Telehealth: Payer: Self-pay | Admitting: Pulmonary Disease

## 2012-07-06 MED ORDER — SIMVASTATIN 40 MG PO TABS: 40.0000 mg | ORAL_TABLET | Freq: Every day | ORAL | Status: DC

## 2012-07-06 NOTE — Telephone Encounter (Signed)
Pt is requesting refill on simvastatin.  Last refilled 04/13/11 #90 x 0 refills. Pt last seen 11/09/09 No pending appt lmomtcb x1--pt needs OV for further refills and must keep appt

## 2012-07-06 NOTE — Telephone Encounter (Signed)
Pt scheduled appt and aware must keep appt. RX sent. Nothing further was needed

## 2012-08-10 ENCOUNTER — Ambulatory Visit: Payer: Self-pay | Admitting: Pulmonary Disease

## 2012-08-15 ENCOUNTER — Encounter: Payer: Self-pay | Admitting: Adult Health

## 2012-08-15 ENCOUNTER — Ambulatory Visit (INDEPENDENT_AMBULATORY_CARE_PROVIDER_SITE_OTHER): Payer: Managed Care, Other (non HMO) | Admitting: Adult Health

## 2012-08-15 ENCOUNTER — Telehealth: Payer: Self-pay | Admitting: Adult Health

## 2012-08-15 ENCOUNTER — Other Ambulatory Visit (INDEPENDENT_AMBULATORY_CARE_PROVIDER_SITE_OTHER): Payer: Managed Care, Other (non HMO)

## 2012-08-15 VITALS — BP 116/80 | HR 70 | Temp 97.1°F | Ht 71.0 in | Wt 176.5 lb

## 2012-08-15 DIAGNOSIS — E7800 Pure hypercholesterolemia, unspecified: Secondary | ICD-10-CM

## 2012-08-15 DIAGNOSIS — Z85828 Personal history of other malignant neoplasm of skin: Secondary | ICD-10-CM

## 2012-08-15 LAB — CBC
HCT: 47.3 % (ref 39.0–52.0)
Hemoglobin: 16.3 g/dL (ref 13.0–17.0)
RBC: 5.28 Mil/uL (ref 4.22–5.81)
WBC: 7.4 10*3/uL (ref 4.5–10.5)

## 2012-08-15 LAB — BASIC METABOLIC PANEL
BUN: 14 mg/dL (ref 6–23)
CO2: 30 mEq/L (ref 19–32)
Chloride: 104 mEq/L (ref 96–112)
Potassium: 4.4 mEq/L (ref 3.5–5.1)

## 2012-08-15 LAB — TSH: TSH: 2.87 u[IU]/mL (ref 0.35–5.50)

## 2012-08-15 LAB — HEPATIC FUNCTION PANEL
ALT: 21 U/L (ref 0–53)
AST: 17 U/L (ref 0–37)
Alkaline Phosphatase: 53 U/L (ref 39–117)
Total Bilirubin: 0.8 mg/dL (ref 0.3–1.2)

## 2012-08-15 LAB — LIPID PANEL
Cholesterol: 163 mg/dL (ref 0–200)
Triglycerides: 86 mg/dL (ref 0.0–149.0)

## 2012-08-15 MED ORDER — SIMVASTATIN 40 MG PO TABS: 40.0000 mg | ORAL_TABLET | Freq: Every day | ORAL | Status: DC

## 2012-08-15 MED ORDER — ZOSTER VACCINE LIVE 19400 UNT/0.65ML ~~LOC~~ SOLR: 0.6500 mL | Freq: Once | SUBCUTANEOUS | Status: DC

## 2012-08-15 NOTE — Assessment & Plan Note (Signed)
follow up with dermatology as planned  Encouraged on sunscreen

## 2012-08-15 NOTE — Assessment & Plan Note (Signed)
Fasting labs today  Plan   Low-fat low-cholesterol diet. Continue with exercise regimen as tolerated. Continued on simvastatin 40 mg at bedtime. Follow with Dr. Kriste Basque in one year for a physical. I will call with lab results.

## 2012-08-15 NOTE — Telephone Encounter (Signed)
Spoke with patient, patient aware shingles vac has been sent to verified pharmacy Nothing further needed at this time

## 2012-08-15 NOTE — Progress Notes (Signed)
Subjective:    Patient ID: Roy Wyatt, male    DOB: 10-17-1955, 57 y.o.   MRN: [id number]  HPI 57 y/o WM here for a follow up visit...    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 he had routine colonoscopy 11/10 by DrJacobs w/ mild divertics & tubular adenoma removed- f/u planned 55yrs... no new complaints or concerns, & he just wants his Simvastatin refilled today.   08/15/2012 Follow up  Patient returns for a yearly followup Last seen in our office in October 2011. Patient reports he has been doing exceptionally well. Says he had no issues since his last visit and feels he has been in good health. He exercises on average 3 times a week. He denies any chest pain, shortness, of breath, abdominal pain, nausea, vomiting, and unintentional weight loss, leg swelling, orthopnea, or palpitations. Patient request a refill of his simvastatin and is fasting today except for some coffee. This morning  Current Problem List:  PHYSICAL EXAMINATION (ICD-V70.0) / Health Maintenance - good general health... 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  Hx of PNEUMONIA (ICD-486) - yrs ago, no known residual problems...  HYPERCHOLESTEROLEMIA (ICD-272.0) - on SIMVASTATIN 40mg /d, Fish Oil, + diet...  ~ FLP 4/07 showed TChol 248, TG 132, HDL 48, LDL 174... rec to start Simva40...  ~ FLP 5/08 on Simva40 irregularly showed TChol 225, TG 206, HDL 43, LDL 141... rec- take daily...  ~ FLP 11/09 on Simva40 showed TChol 147, TG 91, HDL 32, LDL 97  ~ FLP 1/11 on Simva40 showed TChol 209, TG 112, HDL 42, LDL 151... reminded to take daily.  DIVERTICULOSIS OF COLON (ICD-562.10)  COLONIC POLYPS (ICD-211.3) - routine colonosopy 11/10 by DrJacobs showed mild divertics & 2 polyps= tubular ademoma, f/u planned 55yrs...  DEGENERATIVE JOINT DISEASE (ICD-715.90) -  hx prev knee arthroscopy...  Hx of BACK PAIN, LUMBAR (ICD-724.2) - he states "spurs" on prev eval...  SKIN CANCER, HX OF (ICD-V10.83) - he sees Dr Donzetta Starch annually for eval> superfic basal cell cancers removed from chest & leg 6/11...  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     Review of Systems Constitutional:   No  weight loss, night sweats,  Fevers, chills, fatigue, or  lassitude.  HEENT:   No headaches,  Difficulty swallowing,  Tooth/dental problems, or  Sore throat,                No sneezing, itching, ear ache, nasal congestion, post nasal drip,   CV:  No chest pain,  Orthopnea, PND, swelling in lower extremities, anasarca, dizziness, palpitations, syncope.   GI  No heartburn, indigestion, abdominal pain, nausea, vomiting, diarrhea, change in bowel habits, loss of appetite, bloody stools.   Resp: No shortness of breath with exertion or at rest.  No excess mucus, no productive cough,  No non-productive cough,  No coughing up of blood.  No change in color of mucus.  No wheezing.  No chest wall deformity  Skin: no rash or lesions.  GU: no dysuria, change in color of urine, no urgency or frequency.  No flank pain, no hematuria   MS:  No joint pain or swelling.  No decreased range of motion.  No back pain.  Psych:  No change in mood or affect. No depression or anxiety.  No memory loss.         Objective:   Physical Exam GEN: A/Ox3; pleasant , NAD, well nourished   HEENT:  [place]/AT,  EACs-clear, TMs-wnl, NOSE-clear, THROAT-clear, no lesions, no postnasal drip or exudate noted.   NECK:  Supple w/ fair ROM; no JVD; normal carotid impulses w/o bruits; no thyromegaly or nodules palpated; no lymphadenopathy.  RESP  Clear  P & A; w/o, wheezes/ rales/ or rhonchi.no accessory muscle use, no dullness to percussion  CARD:  RRR, no m/r/g  , no peripheral edema, pulses  intact, no cyanosis or clubbing.  GI:   Soft & nt; nml bowel sounds; no organomegaly or masses detected.  Musco: Warm bil, no deformities or joint swelling noted.   Neuro: alert, no focal deficits noted.    Skin: Warm, no lesions or rashes         Assessment & Plan:

## 2012-08-15 NOTE — Patient Instructions (Addendum)
Low-fat low-cholesterol diet. Continue with exercise regimen as tolerated. Continued on simvastatin 40 mg at bedtime. Follow with Dr. Kriste Basque in one year for a physical. I will call with lab results.

## 2012-08-15 NOTE — Telephone Encounter (Signed)
Lauren do you have this RX on hard script so TP can sign? Please advise thanks

## 2012-08-15 NOTE — Telephone Encounter (Signed)
Pt returning call says his rx can be sent to [facility].Raylene Everts

## 2012-08-15 NOTE — Telephone Encounter (Signed)
Patient seen today in the office by TP Pt Requesting shingles Rx ATC patient to find out which pharmacy patient would like Rx for vaccination sent to No answer, LMOMTCB

## 2012-08-16 ENCOUNTER — Telehealth: Payer: Self-pay | Admitting: Pulmonary Disease

## 2012-08-16 MED ORDER — ZOSTER VACCINE LIVE 19400 UNT/0.65ML ~~LOC~~ SOLR: 0.6500 mL | Freq: Once | SUBCUTANEOUS | Status: DC

## 2012-08-16 NOTE — Telephone Encounter (Signed)
rx sent and pt is aware. Christionna Poland, CMA  

## 2012-08-17 NOTE — Progress Notes (Signed)
Quick Note:  Spoke with patient,  Informed him of results and recs as listed below Pt verbalized understanding and nothing further needed at this time ______

## 2012-09-06 ENCOUNTER — Telehealth: Payer: Self-pay | Admitting: Adult Health

## 2012-09-06 NOTE — Telephone Encounter (Signed)
Fax received from patient:  "Is it possible to complete the bottom portion of this document and fax back to [phone] attn [employer]; it replaces our Wellness Exam at work.  Any questions please call [phone]."  Pt seen by TP 8.6.14 with physical labs Bottom portion of attached form filled out and signed by TP Form faxed to the number above LMOM TCB x1 to inform pt

## 2012-09-17 NOTE — Telephone Encounter (Signed)
Pt is active with mychart Message sent to patient Will sign off.

## 2012-09-18 ENCOUNTER — Telehealth: Payer: Self-pay | Admitting: Pulmonary Disease

## 2012-09-18 NOTE — Telephone Encounter (Signed)
Spoke with patient, Patient states he did not receive Wellness Form that was previously faxed on 09/06/12 Patient requesting form now be faxed to [phone] Form has been faxed patient is aware and nothing further needed at this time.

## 2012-09-18 NOTE — Telephone Encounter (Signed)
Correction to previous telephone note on 09/18/12: Patient returned call and provided new # for form to be faxed to Faxed to [phone] Nothing further at this time

## 2012-11-15 ENCOUNTER — Other Ambulatory Visit: Payer: Self-pay

## 2013-09-11 ENCOUNTER — Encounter: Payer: Self-pay | Admitting: Gastroenterology

## 2013-09-17 ENCOUNTER — Encounter: Payer: Self-pay | Admitting: Gastroenterology

## 2013-09-25 ENCOUNTER — Encounter: Payer: Self-pay | Admitting: Gastroenterology

## 2013-10-29 ENCOUNTER — Ambulatory Visit (AMBULATORY_SURGERY_CENTER): Payer: No Typology Code available for payment source

## 2013-10-29 VITALS — Ht 71.0 in | Wt 176.4 lb

## 2013-10-29 DIAGNOSIS — Z86010 Personal history of colonic polyps: Secondary | ICD-10-CM

## 2013-10-29 MED ORDER — MOVIPREP 100 G PO SOLR: ORAL | Status: DC

## 2013-10-29 NOTE — Progress Notes (Signed)
Per pt, no allergies to soy or egg products.Pt not taking any weight loss meds or using  O2 at home. 

## 2013-11-04 ENCOUNTER — Encounter: Payer: Self-pay | Admitting: Gastroenterology

## 2013-11-12 ENCOUNTER — Encounter: Payer: Self-pay | Admitting: Gastroenterology

## 2013-11-12 ENCOUNTER — Ambulatory Visit (AMBULATORY_SURGERY_CENTER): Payer: No Typology Code available for payment source | Admitting: Gastroenterology

## 2013-11-12 VITALS — BP 121/70 | HR 58 | Temp 97.6°F | Resp 16 | Ht 71.0 in | Wt 176.0 lb

## 2013-11-12 DIAGNOSIS — K5730 Diverticulosis of large intestine without perforation or abscess without bleeding: Secondary | ICD-10-CM

## 2013-11-12 DIAGNOSIS — D125 Benign neoplasm of sigmoid colon: Secondary | ICD-10-CM

## 2013-11-12 DIAGNOSIS — Z86010 Personal history of colonic polyps: Secondary | ICD-10-CM

## 2013-11-12 MED ORDER — SODIUM CHLORIDE 0.9 % IV SOLN: 500.0000 mL | INTRAVENOUS | Status: DC

## 2013-11-12 NOTE — Progress Notes (Signed)
Report to PACU, RN, vss, BBS= Clear.  

## 2013-11-12 NOTE — Op Note (Signed)
[place]  [address] [place], [id number]   COLONOSCOPY PROCEDURE REPORT  PATIENT: Roy Wyatt, Roy Wyatt  MR#: [id number] BIRTHDATE: [birth year] , 56  yrs. old GENDER: male ENDOSCOPIST: Milus Banister, MD PROCEDURE DATE:  11/12/2013 PROCEDURE:   Colonoscopy with snare polypectomy First Screening Colonoscopy - Avg.  risk and is 50 yrs.  old or older - No.  Prior Negative Screening - Now for repeat screening. N/A  History of Adenoma - Now for follow-up colonoscopy & has been > or = to 3 yrs.  Yes hx of adenoma.  Has been 3 or more years since last colonoscopy.  Polyps Removed Today? Yes. ASA CLASS:   Class II INDICATIONS:two diminutive polyps 2010, one was a tubular adenoma.  MEDICATIONS: Monitored anesthesia care and Propofol 240 mg IV  DESCRIPTION OF PROCEDURE:   After the risks benefits and alternatives of the procedure were thoroughly explained, informed consent was obtained.  The digital rectal exam revealed no abnormalities of the rectum.   The [id number]  endoscope was introduced through the anus and advanced to the cecum, which was identified by both the appendix and ileocecal valve. No adverse events experienced.   The quality of the prep was excellent.  The instrument was then slowly withdrawn as the colon was fully examined.  COLON FINDINGS: A sessile polyp measuring 3 mm in size was found in the sigmoid colon.  A polypectomy was performed with a cold snare. There was mild diverticulosis noted in the left colon.   The examination was otherwise normal.  Retroflexed views revealed no abnormalities. The time to cecum=2 minutes 24 seconds.  Withdrawal time=6 minutes 27 seconds.  The scope was withdrawn and the procedure completed. COMPLICATIONS: There were no immediate complications.  ENDOSCOPIC IMPRESSION: 1.   Sessile polyp was found in the sigmoid colon; polypectomy was performed with a cold snare 2.   Mild diverticulosis was noted in the left  colon 3.   The examination was otherwise normal  RECOMMENDATIONS: If the polyp(s) removed today are proven to be adenomatous (pre-cancerous) polyps, you will need a repeat colonoscopy in 5 years.  Otherwise you should continue to follow colorectal cancer screening guidelines for "routine risk" patients with colonoscopy in 10 years.  You will receive a letter within 1-2 weeks with the results of your biopsy as well as final recommendations.  Please call my office if you have not received a letter after 3 weeks.  eSigned:  Milus Banister, MD 11/12/2013 11:27 AM   cc: Teressa Lower, MD

## 2013-11-12 NOTE — Patient Instructions (Signed)
YOU HAD AN ENDOSCOPIC PROCEDURE TODAY AT THE [facility]: Refer to the procedure report that was given to you for any specific questions about what was found during the examination.  If the procedure report does not answer your questions, please call your gastroenterologist to clarify.  If you requested that your care partner not be given the details of your procedure findings, then the procedure report has been included in a sealed envelope for you to review at your convenience later.  YOU SHOULD EXPECT: Some feelings of bloating in the abdomen. Passage of more gas than usual.  Walking can help get rid of the air that was put into your GI tract during the procedure and reduce the bloating. If you had a lower endoscopy (such as a colonoscopy or flexible sigmoidoscopy) you may notice spotting of blood in your stool or on the toilet paper. If you underwent a bowel prep for your procedure, then you may not have a normal bowel movement for a few days.  DIET: Your first meal following the procedure should be a light meal and then it is ok to progress to your normal diet.  A half-sandwich or bowl of soup is an example of a good first meal.  Heavy or fried foods are harder to digest and may make you feel nauseous or bloated.  Likewise meals heavy in dairy and vegetables can cause extra gas to form and this can also increase the bloating.  Drink plenty of fluids but you should avoid alcoholic beverages for 24 hours.  ACTIVITY: Your care partner should take you home directly after the procedure.  You should plan to take it easy, moving slowly for the rest of the day.  You can resume normal activity the day after the procedure however you should NOT DRIVE or use heavy machinery for 24 hours (because of the sedation medicines used during the test).    SYMPTOMS TO REPORT IMMEDIATELY: A gastroenterologist can be reached at any hour.  During normal business hours, 8:30 AM to 5:00 PM Monday through Friday,  call [phone].  After hours and on weekends, please call the GI answering service at [phone] who will take a message and have the physician on call contact you.   Following lower endoscopy (colonoscopy or flexible sigmoidoscopy):  Excessive amounts of blood in the stool  Significant tenderness or worsening of abdominal pains  Swelling of the abdomen that is new, acute  Fever of 100F or higher    FOLLOW UP: If any biopsies were taken you will be contacted by phone or by letter within the next 1-3 weeks.  Call your gastroenterologist if you have not heard about the biopsies in 3 weeks.  Our staff will call the home number listed on your records the next business day following your procedure to check on you and address any questions or concerns that you may have at that time regarding the information given to you following your procedure. This is a courtesy call and so if there is no answer at the home number and we have not heard from you through the emergency physician on call, we will assume that you have returned to your regular daily activities without incident.  SIGNATURES/CONFIDENTIALITY: You and/or your care partner have signed paperwork which will be entered into your electronic medical record.  These signatures attest to the fact that that the information above on your After Visit Summary has been reviewed and is understood.  Full responsibility of the confidentiality   of this discharge information lies with you and/or your care-partner.     

## 2013-11-12 NOTE — Progress Notes (Signed)
Called to room to assist during endoscopic procedure.  Patient ID and intended procedure confirmed with present staff. Received instructions for my participation in the procedure from the performing physician.  

## 2013-11-13 ENCOUNTER — Telehealth: Payer: Self-pay | Admitting: *Deleted

## 2013-11-13 NOTE — Telephone Encounter (Signed)
  Follow up Call-  Call back number 11/12/2013  Post procedure Call Back phone  # [phone]  Permission to leave phone message Yes     Patient questions:  Do you have a fever, pain , or abdominal swelling? No. Pain Score  0 *  Have you tolerated food without any problems? Yes.    Have you been able to return to your normal activities? Yes.    Do you have any questions about your discharge instructions: Diet   No. Medications  No. Follow up visit  No.  Do you have questions or concerns about your Care? No.  Actions: * If pain score is 4 or above: No action needed, pain <4.

## 2013-11-22 ENCOUNTER — Encounter: Payer: Self-pay | Admitting: Gastroenterology

## 2013-12-26 ENCOUNTER — Telehealth: Payer: Self-pay | Admitting: Pulmonary Disease

## 2013-12-26 MED ORDER — SIMVASTATIN 40 MG PO TABS: 40.0000 mg | ORAL_TABLET | Freq: Every day | ORAL | Status: DC

## 2013-12-26 NOTE — Telephone Encounter (Signed)
Pt last saw SN 11/31/11 Pt saw TP on 08/15/12 Pt is needing refill on simvistatin. Pt does not have a PCP Please advise SN thanks

## 2013-12-26 NOTE — Telephone Encounter (Signed)
Per SN- okay to refill med # 90 no refills  Needs to est with a PCP- Dr Doug Sou or Dr Jenny Reichmann are taking new pt's  Rx was sent to pharm  Pt aware and number to [facility] given

## 2014-03-31 ENCOUNTER — Other Ambulatory Visit: Payer: Self-pay | Admitting: Pulmonary Disease

## 2014-04-16 ENCOUNTER — Telehealth: Payer: Self-pay | Admitting: Pulmonary Disease

## 2014-04-16 NOTE — Telephone Encounter (Signed)
Per SN: At [facility] or Jenny Reichmann At [place] office- Sarajane Jews or Shawna Orleans At [facility] office- Birdie Riddle or Randel Pigg  lmtcb for pt to relay providers.

## 2014-04-16 NOTE — Telephone Encounter (Signed)
[phone] calling back

## 2014-04-16 NOTE — Telephone Encounter (Signed)
Spoke with pt, states that pt was given names of PCP's by SN but has lost this list.  Pt is requesting a list of PCP's to look into establishing with.  Also, pt is requesting a refill on his simvastatin to last him until he establishes with a new provider.  Last ov: 08/15/2012 Next ov: none   Dr. Lenna Gilford please advise on PCP's, and if you're ok with refilling simvastatin until he establishes elsewhere.  Thanks!

## 2014-04-16 NOTE — Telephone Encounter (Signed)
Spoke with patient, aware of rec's per SN for PCP Pt is asking if he can have his Simvastatin refilled until he establishes elsewhere. Thanks! SN-pls advise

## 2014-04-21 MED ORDER — SIMVASTATIN 40 MG PO TABS: 40.0000 mg | ORAL_TABLET | Freq: Every day | ORAL | Status: AC

## 2014-04-21 NOTE — Telephone Encounter (Signed)
Per SN, ok to refill pt simivastatin until established with new PCP. Pt called and informed that medication would be refilled. Pt voiced understanding. Nothing further is needed at this time.

## 2017-03-03 ENCOUNTER — Encounter: Payer: Self-pay | Admitting: Gastroenterology

## 2017-03-03 ENCOUNTER — Ambulatory Visit: Payer: Managed Care, Other (non HMO) | Admitting: Gastroenterology

## 2017-03-03 VITALS — BP 102/60 | HR 62 | Ht 71.0 in | Wt 174.0 lb

## 2017-03-03 DIAGNOSIS — K921 Melena: Secondary | ICD-10-CM | POA: Diagnosis not present

## 2017-03-03 MED ORDER — PEG 3350-KCL-NA BICARB-NACL 420 G PO SOLR: 4000.0000 mL | Freq: Once | ORAL | 0 refills | Status: AC

## 2017-03-03 NOTE — Patient Instructions (Addendum)
You will be set up for a colonoscopy for microscopic blood in your stool (EGD at same time if no clear answer in your colon).  Normal BMI (Body Mass Index- based on height and weight) is between 19 and 25. Your BMI today is Body mass index is 24.27 kg/m. Marland Kitchen Please consider follow up  regarding your BMI with your Primary Care Provider.

## 2017-03-03 NOTE — Progress Notes (Signed)
Review of pertinent gastrointestinal problems: 1.  Adenomatous colon polyps.  Colonoscopy 1998-10-12 diminutive polyps, one was a tubular adenoma.  Colonoscopy 11/2013 diverticulosis and a single 3 mm polyp in the sigmoid colon (hyperplastic).  He was recommended to have repeat colonoscopy at 10-year interval  HPI: This is a  very pleasant 62 year old man who was referred to me by Crist Infante, MD  to evaluate microscopic blood in his stool.    Chief complaint is microscopic blood in his stool  I last saw him a little over 3 years ago at the time of colonoscopy.  See that results summarized above.  He had colon cancer screening through his primary care physician recently and the stool tests were positive.  He has no GI symptoms at all, no overt bleeding, no change in his bowels, no significant abdominal pains.  He does take Advil 2 or 3 pills about twice a week and a baby aspirin once daily.  He has lost 3 or 4 pounds since his wife started him on a keto diet    Old Data Reviewed: labs12/2018 shows normal CBC.  Stool testing by primary care physician shows positive heme assure test and 3+ Hemoccult cards.    Review of systems: Pertinent positive and negative review of systems were noted in the above HPI section. All other review negative.   Past Medical History:  Diagnosis Date  . Arthritis   . Cyst    left leg  . Diverticulosis of colon   . DJD (degenerative joint disease)   . History of colonic polyps   . History of pneumonia   . History of skin cancer    on face  . Hyperlipidemia   . Lumbar back pain   . Nasal congestion     Past Surgical History:  Procedure Laterality Date  . cartilage repair  2000 - approx   knee  . CYST EXCISION     on left leg    Current Outpatient Medications  Medication Sig Dispense Refill  . aspirin 81 MG tablet Take 81 mg by mouth daily.    . [place]-Glucosamine-Vit D (GLUCOSAMINE COMPLEX PO) Take by mouth. Take two daily    .  simvastatin (ZOCOR) 40 MG tablet Take 40 mg by mouth daily.    . simvastatin (ZOCOR) 40 MG tablet Take 1 tablet (40 mg total) by mouth at bedtime. 90 tablet 1   No current facility-administered medications for this visit.     Allergies as of 03/03/2017  . (No Known Allergies)    Family History  Problem Relation Age of Onset  . Cancer Mother        liver, skin  . Cancer Father        skin  . Colon cancer Neg Hx     Social History   Socioeconomic History  . Marital status: Married    Spouse name: Not on file  . Number of children: Not on file  . Years of education: Not on file  . Highest education level: Not on file  Social Needs  . Financial resource strain: Not on file  . Food insecurity - worry: Not on file  . Food insecurity - inability: Not on file  . Transportation needs - medical: Not on file  . Transportation needs - non-medical: Not on file  Occupational History  . 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 Smoking status: Never Smoker  . Smokeless tobacco: Never Used  Substance and  Sexual Activity  . Alcohol use: Yes    Alcohol/week: 1.8 - 3.0 oz    Types: 3 - 5 Standard drinks or equivalent per week    Comment: 3 - 4 per week  . Drug use: No  . Sexual activity: Not on file  Other Topics Concern  . Not on file  Social History Narrative  . Not on file     Physical Exam: BP 102/60   Pulse 62   Ht 5\' 11"  (1.803 m)   Wt 174 lb (78.9 kg)   BMI 24.27 kg/m  Constitutional: generally well-appearing Psychiatric: alert and oriented x3 Eyes: extraocular movements intact Mouth: oral pharynx moist, no lesions Neck: supple no lymphadenopathy Cardiovascular: heart regular rate and rhythm Lungs: clear to auscultation bilaterally Abdomen: soft, nontender, nondistended, no obvious ascites, no peritoneal signs, normal bowel sounds Extremities: no lower extremity edema bilaterally Skin: no lesions on visible  extremities   Assessment and plan: 62 y.o. male with positive colon cancer screening test, microscopic blood in his stool  I recommended we proceed with colonoscopy at his soonest convenience and if there is no clear explanation for his microscopic blood in his stool then I would proceed with a EGD at the same time.  It is certainly possible that his daily baby aspirin and intermittent NSAIDs are causing some mild GI irritation leading to the positive colon cancer screening test above.  Please see the "Patient Instructions" section for addition details about the plan.   Owens Loffler, MD [facility] 03/03/2017, 9:08 AM  Cc: Crist Infante, MD

## 2017-03-27 DIAGNOSIS — R972 Elevated prostate specific antigen [PSA]: Secondary | ICD-10-CM | POA: Diagnosis not present

## 2017-03-27 DIAGNOSIS — N3943 Post-void dribbling: Secondary | ICD-10-CM | POA: Diagnosis not present

## 2017-03-27 DIAGNOSIS — N401 Enlarged prostate with lower urinary tract symptoms: Secondary | ICD-10-CM | POA: Diagnosis not present

## 2017-03-31 DIAGNOSIS — R972 Elevated prostate specific antigen [PSA]: Secondary | ICD-10-CM | POA: Diagnosis not present

## 2017-04-03 ENCOUNTER — Encounter: Payer: Self-pay | Admitting: Gastroenterology

## 2017-04-14 ENCOUNTER — Ambulatory Visit (AMBULATORY_SURGERY_CENTER): Payer: BLUE CROSS/BLUE SHIELD | Admitting: Gastroenterology

## 2017-04-14 ENCOUNTER — Other Ambulatory Visit: Payer: Self-pay

## 2017-04-14 ENCOUNTER — Encounter: Payer: Self-pay | Admitting: Gastroenterology

## 2017-04-14 VITALS — BP 124/75 | HR 60 | Temp 98.0°F | Resp 8 | Ht 71.0 in | Wt 174.0 lb

## 2017-04-14 DIAGNOSIS — K921 Melena: Secondary | ICD-10-CM | POA: Diagnosis not present

## 2017-04-14 DIAGNOSIS — K5730 Diverticulosis of large intestine without perforation or abscess without bleeding: Secondary | ICD-10-CM | POA: Diagnosis not present

## 2017-04-14 DIAGNOSIS — R195 Other fecal abnormalities: Secondary | ICD-10-CM | POA: Diagnosis present

## 2017-04-14 DIAGNOSIS — D123 Benign neoplasm of transverse colon: Secondary | ICD-10-CM

## 2017-04-14 DIAGNOSIS — Z86010 Personal history of colonic polyps: Secondary | ICD-10-CM | POA: Diagnosis not present

## 2017-04-14 MED ORDER — SODIUM CHLORIDE 0.9 % IV SOLN: 500.0000 mL | Freq: Once | INTRAVENOUS | Status: AC

## 2017-04-14 NOTE — Op Note (Signed)
[place] Patient Name: Roy Wyatt Procedure Date: 04/14/2017 1:51 PM MRN: [id number] Endoscopist: Milus Banister , MD Age: 62 Referring MD:  Date of Birth: 02-04-1955 Gender: Male Account #: [id number] Procedure:                Upper GI endoscopy Indications:              Heme positive stool Medicines:                Monitored Anesthesia Care Procedure:                Pre-Anesthesia Assessment:                           - Prior to the procedure, a History and Physical                            was performed, and patient medications and                            allergies were reviewed. The patient's tolerance of                            previous anesthesia was also reviewed. The risks                            and benefits of the procedure and the sedation                            options and risks were discussed with the patient.                            All questions were answered, and informed consent                            was obtained. Prior Anticoagulants: The patient has                            taken no previous anticoagulant or antiplatelet                            agents. ASA Grade Assessment: II - A patient with                            mild systemic disease. After reviewing the risks                            and benefits, the patient was deemed in                            satisfactory condition to undergo the procedure.                           After obtaining informed consent, the endoscope was  passed under direct vision. Throughout the                            procedure, the patient's blood pressure, pulse, and                            oxygen saturations were monitored continuously. The                            Endoscope was introduced through the mouth, and                            advanced to the second part of duodenum. The upper                            GI endoscopy was accomplished without  difficulty.                            The patient tolerated the procedure well. Scope In: Scope Out: Findings:                 The esophagus was normal.                           The stomach was normal.                           The examined duodenum was normal. Complications:            No immediate complications. Estimated blood loss:                            None. Estimated Blood Loss:     Estimated blood loss: none. Impression:               - Normal UGI tract. Recommendation:           - Patient has a contact number available for                            emergencies. The signs and symptoms of potential                            delayed complications were discussed with the                            patient. Return to normal activities tomorrow.                            Written discharge instructions were provided to the                            patient.                           - Resume previous diet.                           -  Continue present medications. Milus Banister, MD 04/14/2017 2:19:56 PM This report has been signed electronically.

## 2017-04-14 NOTE — Patient Instructions (Signed)
Handouts given : Polyps and Diverticulosis.  YOU HAD AN ENDOSCOPIC PROCEDURE TODAY AT [facility]:   Refer to the procedure report that was given to you for any specific questions about what was found during the examination.  If the procedure report does not answer your questions, please call your gastroenterologist to clarify.  If you requested that your care partner not be given the details of your procedure findings, then the procedure report has been included in a sealed envelope for you to review at your convenience later.  YOU SHOULD EXPECT: Some feelings of bloating in the abdomen. Passage of more gas than usual.  Walking can help get rid of the air that was put into your GI tract during the procedure and reduce the bloating. If you had a lower endoscopy (such as a colonoscopy or flexible sigmoidoscopy) you may notice spotting of blood in your stool or on the toilet paper. If you underwent a bowel prep for your procedure, you may not have a normal bowel movement for a few days.  Please Note:  You might notice some irritation and congestion in your nose or some drainage.  This is from the oxygen used during your procedure.  There is no need for concern and it should clear up in a day or so.  SYMPTOMS TO REPORT IMMEDIATELY:   Following lower endoscopy (colonoscopy or flexible sigmoidoscopy):  Excessive amounts of blood in the stool  Significant tenderness or worsening of abdominal pains  Swelling of the abdomen that is new, acute  Fever of 100F or higher   Following upper endoscopy (EGD)  Vomiting of blood or coffee ground material  New chest pain or pain under the shoulder blades  Painful or persistently difficult swallowing  New shortness of breath  Fever of 100F or higher  Black, tarry-looking stools  For urgent or emergent issues, a gastroenterologist can be reached at any hour by calling [phone].   DIET:  We do recommend a small meal at first, but then  you may proceed to your regular diet.  Drink plenty of fluids but you should avoid alcoholic beverages for 24 hours.  ACTIVITY:  You should plan to take it easy for the rest of today and you should NOT DRIVE or use heavy machinery until tomorrow (because of the sedation medicines used during the test).    FOLLOW UP: Our staff will call the number listed on your records the next business day following your procedure to check on you and address any questions or concerns that you may have regarding the information given to you following your procedure. If we do not reach you, we will leave a message.  However, if you are feeling well and you are not experiencing any problems, there is no need to return our call.  We will assume that you have returned to your regular daily activities without incident.  If any biopsies were taken you will be contacted by phone or by letter within the next 1-3 weeks.  Please call us at [phone] if you have not heard about the biopsies in 3 weeks.    SIGNATURES/CONFIDENTIALITY: You and/or your care partner have signed paperwork which will be entered into your electronic medical record.  These signatures attest to the fact that that the information above on your After Visit Summary has been reviewed and is understood.  Full responsibility of the confidentiality of this discharge information lies with you and/or your care-partner.

## 2017-04-14 NOTE — Progress Notes (Signed)
Called to room to assist during endoscopic procedure.  Patient ID and intended procedure confirmed with present staff. Received instructions for my participation in the procedure from the performing physician.  

## 2017-04-14 NOTE — Op Note (Signed)
[place] Patient Name: Roy Wyatt Procedure Date: 04/14/2017 1:52 PM MRN: [id number] Endoscopist: Milus Banister , MD Age: 62 Referring MD:  Date of Birth: April 30, 1955 Gender: Male Account #: [id number] Procedure:                Colonoscopy Indications:              Heme positive stool, also Colonoscopy 1998-10-12                            diminutive polyps, one was a tubular adenoma.                            Colonoscopy 11/2013 diverticulosis and a single 3                            mm polyp in the sigmoid colon (hyperplastic). He                            was recommended to have repeat colonoscopy at                            10-year interval Medicines:                Monitored Anesthesia Care Procedure:                Pre-Anesthesia Assessment:                           - Prior to the procedure, a History and Physical                            was performed, and patient medications and                            allergies were reviewed. The patient's tolerance of                            previous anesthesia was also reviewed. The risks                            and benefits of the procedure and the sedation                            options and risks were discussed with the patient.                            All questions were answered, and informed consent                            was obtained. Prior Anticoagulants: The patient has                            taken no previous anticoagulant or antiplatelet  agents. ASA Grade Assessment: II - A patient with                            mild systemic disease. After reviewing the risks                            and benefits, the patient was deemed in                            satisfactory condition to undergo the procedure.                           After obtaining informed consent, the colonoscope                            was passed under direct vision. Throughout the                     procedure, the patient's blood pressure, pulse, and                            oxygen saturations were monitored continuously. The                            Colonoscope was introduced through the anus and                            advanced to the the cecum, identified by                            appendiceal orifice and ileocecal valve. The                            colonoscopy was performed without difficulty. The                            patient tolerated the procedure well. The quality                            of the bowel preparation was good. The ileocecal                            valve, appendiceal orifice, and rectum were                            photographed. Scope In: 1:57:11 PM Scope Out: 2:10:51 PM Scope Withdrawal Time: 0 hours 11 minutes 23 seconds  Total Procedure Duration: 0 hours 13 minutes 40 seconds  Findings:                 A 5 mm polyp was found in the transverse colon. The                            polyp was sessile. The polyp was removed with a  cold snare. Resection and retrieval were complete.                           Many small-mouthed diverticula were found in the                            left colon.                           The exam was otherwise without abnormality on                            direct and retroflexion views. Complications:            No immediate complications. Estimated blood loss:                            None. Estimated Blood Loss:     Estimated blood loss: none. Impression:               - One 5 mm polyp in the transverse colon, removed                            with a cold snare. Resected and retrieved.                           - Diverticulosis in the left colon.                           - The examination was otherwise normal on direct                            and retroflexion views. Recommendation:           - Patient has a contact number available for                             emergencies. The signs and symptoms of potential                            delayed complications were discussed with the                            patient. Return to normal activities tomorrow.                            Written discharge instructions were provided to the                            patient.                           - Resume previous diet.                           - Continue present medications.  You will receive a letter within 2-3 weeks with the                            pathology results and my final recommendations.                           If the polyp(s) is proven to be 'pre-cancerous' on                            pathology, you will need repeat colonoscopy in 5                            years. If the polyp(s) is NOT 'precancerous' on                            pathology then you should repeat colon cancer                            screening in 10 years with colonoscopy without need                            for colon cancer screening by any method prior to                            then (including annual stool testing). Milus Banister, MD 04/14/2017 2:18:49 PM This report has been signed electronically.

## 2017-04-14 NOTE — Progress Notes (Signed)
Report to PACU, RN, vss, BBS= Clear.  

## 2017-04-17 ENCOUNTER — Telehealth: Payer: Self-pay

## 2017-04-17 NOTE — Telephone Encounter (Signed)
  Follow up Call-  Call Roy Wyatt number 04/14/2017  Post procedure Call Roy Wyatt phone  # [phone]  Permission to leave phone message Yes  Some recent data might be hidden     Patient questions:  Do you have a fever, pain , or abdominal swelling? No. Pain Score  0 *  Have you tolerated food without any problems? Yes.    Have you been able to return to your normal activities? Yes.    Do you have any questions about your discharge instructions: Diet   No. Medications  No. Follow up visit  No.  Do you have questions or concerns about your Care? No.  Actions: * If pain score is 4 or above: No action needed, pain <4.

## 2017-04-24 ENCOUNTER — Encounter: Payer: Self-pay | Admitting: Gastroenterology

## 2017-11-03 DIAGNOSIS — Z23 Encounter for immunization: Secondary | ICD-10-CM | POA: Diagnosis not present

## 2018-01-15 DIAGNOSIS — L821 Other seborrheic keratosis: Secondary | ICD-10-CM | POA: Diagnosis not present

## 2018-01-15 DIAGNOSIS — D485 Neoplasm of uncertain behavior of skin: Secondary | ICD-10-CM | POA: Diagnosis not present

## 2018-01-15 DIAGNOSIS — D225 Melanocytic nevi of trunk: Secondary | ICD-10-CM | POA: Diagnosis not present

## 2018-01-15 DIAGNOSIS — L570 Actinic keratosis: Secondary | ICD-10-CM | POA: Diagnosis not present

## 2018-01-15 DIAGNOSIS — Z85828 Personal history of other malignant neoplasm of skin: Secondary | ICD-10-CM | POA: Diagnosis not present

## 2018-02-07 DIAGNOSIS — Z0000 Encounter for general adult medical examination without abnormal findings: Secondary | ICD-10-CM | POA: Diagnosis not present

## 2018-02-07 DIAGNOSIS — Z125 Encounter for screening for malignant neoplasm of prostate: Secondary | ICD-10-CM | POA: Diagnosis not present

## 2018-02-07 DIAGNOSIS — R82998 Other abnormal findings in urine: Secondary | ICD-10-CM | POA: Diagnosis not present

## 2018-02-14 DIAGNOSIS — R0683 Snoring: Secondary | ICD-10-CM | POA: Diagnosis not present

## 2018-02-14 DIAGNOSIS — E7849 Other hyperlipidemia: Secondary | ICD-10-CM | POA: Diagnosis not present

## 2018-02-14 DIAGNOSIS — C4490 Unspecified malignant neoplasm of skin, unspecified: Secondary | ICD-10-CM | POA: Diagnosis not present

## 2018-02-14 DIAGNOSIS — Z23 Encounter for immunization: Secondary | ICD-10-CM | POA: Diagnosis not present

## 2018-02-14 DIAGNOSIS — Z1331 Encounter for screening for depression: Secondary | ICD-10-CM | POA: Diagnosis not present

## 2018-02-14 DIAGNOSIS — Z1339 Encounter for screening examination for other mental health and behavioral disorders: Secondary | ICD-10-CM | POA: Diagnosis not present

## 2018-02-14 DIAGNOSIS — Z0000 Encounter for general adult medical examination without abnormal findings: Secondary | ICD-10-CM | POA: Diagnosis not present

## 2018-02-14 DIAGNOSIS — R972 Elevated prostate specific antigen [PSA]: Secondary | ICD-10-CM | POA: Diagnosis not present

## 2019-01-16 DIAGNOSIS — L821 Other seborrheic keratosis: Secondary | ICD-10-CM | POA: Diagnosis not present

## 2019-01-16 DIAGNOSIS — Z85828 Personal history of other malignant neoplasm of skin: Secondary | ICD-10-CM | POA: Diagnosis not present

## 2019-01-16 DIAGNOSIS — L570 Actinic keratosis: Secondary | ICD-10-CM | POA: Diagnosis not present

## 2019-01-16 DIAGNOSIS — D225 Melanocytic nevi of trunk: Secondary | ICD-10-CM | POA: Diagnosis not present

## 2019-01-16 DIAGNOSIS — D485 Neoplasm of uncertain behavior of skin: Secondary | ICD-10-CM | POA: Diagnosis not present

## 2019-03-22 DIAGNOSIS — E7849 Other hyperlipidemia: Secondary | ICD-10-CM | POA: Diagnosis not present

## 2019-03-22 DIAGNOSIS — Z0000 Encounter for general adult medical examination without abnormal findings: Secondary | ICD-10-CM | POA: Diagnosis not present

## 2019-03-22 DIAGNOSIS — R7301 Impaired fasting glucose: Secondary | ICD-10-CM | POA: Diagnosis not present

## 2019-03-29 DIAGNOSIS — Z125 Encounter for screening for malignant neoplasm of prostate: Secondary | ICD-10-CM | POA: Diagnosis not present

## 2019-03-29 DIAGNOSIS — R972 Elevated prostate specific antigen [PSA]: Secondary | ICD-10-CM | POA: Diagnosis not present

## 2019-03-29 DIAGNOSIS — R197 Diarrhea, unspecified: Secondary | ICD-10-CM | POA: Diagnosis not present

## 2019-03-29 DIAGNOSIS — R011 Cardiac murmur, unspecified: Secondary | ICD-10-CM | POA: Diagnosis not present

## 2019-03-29 DIAGNOSIS — Z1331 Encounter for screening for depression: Secondary | ICD-10-CM | POA: Diagnosis not present

## 2019-03-29 DIAGNOSIS — Z0000 Encounter for general adult medical examination without abnormal findings: Secondary | ICD-10-CM | POA: Diagnosis not present

## 2019-03-29 DIAGNOSIS — R0683 Snoring: Secondary | ICD-10-CM | POA: Diagnosis not present

## 2019-09-24 DIAGNOSIS — C44519 Basal cell carcinoma of skin of other part of trunk: Secondary | ICD-10-CM | POA: Diagnosis not present

## 2020-04-09 DIAGNOSIS — R7301 Impaired fasting glucose: Secondary | ICD-10-CM | POA: Diagnosis not present

## 2020-04-09 DIAGNOSIS — E785 Hyperlipidemia, unspecified: Secondary | ICD-10-CM | POA: Diagnosis not present

## 2020-04-09 DIAGNOSIS — Z125 Encounter for screening for malignant neoplasm of prostate: Secondary | ICD-10-CM | POA: Diagnosis not present

## 2020-04-16 DIAGNOSIS — E785 Hyperlipidemia, unspecified: Secondary | ICD-10-CM | POA: Diagnosis not present

## 2020-04-16 DIAGNOSIS — Z1331 Encounter for screening for depression: Secondary | ICD-10-CM | POA: Diagnosis not present

## 2020-04-16 DIAGNOSIS — R82998 Other abnormal findings in urine: Secondary | ICD-10-CM | POA: Diagnosis not present

## 2020-04-16 DIAGNOSIS — Z0000 Encounter for general adult medical examination without abnormal findings: Secondary | ICD-10-CM | POA: Diagnosis not present

## 2020-04-17 ENCOUNTER — Other Ambulatory Visit: Payer: Self-pay | Admitting: Internal Medicine

## 2020-04-17 DIAGNOSIS — E785 Hyperlipidemia, unspecified: Secondary | ICD-10-CM

## 2020-05-25 ENCOUNTER — Ambulatory Visit
Admission: RE | Admit: 2020-05-25 | Discharge: 2020-05-25 | Disposition: A | Discharge status: Home / Self Care | Payer: BLUE CROSS/BLUE SHIELD | Source: Ambulatory Visit | Attending: Internal Medicine | Admitting: Internal Medicine

## 2020-05-25 DIAGNOSIS — E785 Hyperlipidemia, unspecified: Secondary | ICD-10-CM | POA: Diagnosis not present

## 2021-05-24 DIAGNOSIS — Z125 Encounter for screening for malignant neoplasm of prostate: Secondary | ICD-10-CM | POA: Diagnosis not present

## 2021-05-24 DIAGNOSIS — R7301 Impaired fasting glucose: Secondary | ICD-10-CM | POA: Diagnosis not present

## 2021-05-24 DIAGNOSIS — E785 Hyperlipidemia, unspecified: Secondary | ICD-10-CM | POA: Diagnosis not present

## 2021-05-31 DIAGNOSIS — Z1389 Encounter for screening for other disorder: Secondary | ICD-10-CM | POA: Diagnosis not present

## 2021-05-31 DIAGNOSIS — Z23 Encounter for immunization: Secondary | ICD-10-CM | POA: Diagnosis not present

## 2021-05-31 DIAGNOSIS — R7301 Impaired fasting glucose: Secondary | ICD-10-CM | POA: Diagnosis not present

## 2021-05-31 DIAGNOSIS — Z1331 Encounter for screening for depression: Secondary | ICD-10-CM | POA: Diagnosis not present

## 2021-05-31 DIAGNOSIS — Z0000 Encounter for general adult medical examination without abnormal findings: Secondary | ICD-10-CM | POA: Diagnosis not present

## 2022-01-05 DIAGNOSIS — D0359 Melanoma in situ of other part of trunk: Secondary | ICD-10-CM | POA: Diagnosis not present

## 2022-01-05 DIAGNOSIS — Z85828 Personal history of other malignant neoplasm of skin: Secondary | ICD-10-CM | POA: Diagnosis not present

## 2022-01-05 DIAGNOSIS — L821 Other seborrheic keratosis: Secondary | ICD-10-CM | POA: Diagnosis not present

## 2022-01-05 DIAGNOSIS — D225 Melanocytic nevi of trunk: Secondary | ICD-10-CM | POA: Diagnosis not present

## 2022-01-05 DIAGNOSIS — L814 Other melanin hyperpigmentation: Secondary | ICD-10-CM | POA: Diagnosis not present

## 2022-01-05 DIAGNOSIS — C44319 Basal cell carcinoma of skin of other parts of face: Secondary | ICD-10-CM | POA: Diagnosis not present

## 2022-01-05 DIAGNOSIS — D485 Neoplasm of uncertain behavior of skin: Secondary | ICD-10-CM | POA: Diagnosis not present

## 2022-01-05 DIAGNOSIS — C44311 Basal cell carcinoma of skin of nose: Secondary | ICD-10-CM | POA: Diagnosis not present

## 2022-01-05 DIAGNOSIS — L905 Scar conditions and fibrosis of skin: Secondary | ICD-10-CM | POA: Diagnosis not present

## 2022-01-05 DIAGNOSIS — L570 Actinic keratosis: Secondary | ICD-10-CM | POA: Diagnosis not present

## 2022-02-24 DIAGNOSIS — D0359 Melanoma in situ of other part of trunk: Secondary | ICD-10-CM | POA: Diagnosis not present

## 2022-02-24 DIAGNOSIS — L988 Other specified disorders of the skin and subcutaneous tissue: Secondary | ICD-10-CM | POA: Diagnosis not present

## 2022-03-03 DIAGNOSIS — C4401 Basal cell carcinoma of skin of lip: Secondary | ICD-10-CM | POA: Diagnosis not present

## 2022-03-17 ENCOUNTER — Ambulatory Visit (HOSPITAL_COMMUNITY)
Admission: RE | Admit: 2022-03-17 | Discharge: 2022-03-17 | Disposition: A | Discharge status: Home / Self Care | Payer: BC Managed Care – PPO | Source: Ambulatory Visit | Attending: Registered Nurse | Admitting: Registered Nurse

## 2022-03-17 ENCOUNTER — Other Ambulatory Visit (HOSPITAL_COMMUNITY): Payer: Self-pay | Admitting: Registered Nurse

## 2022-03-17 DIAGNOSIS — R3129 Other microscopic hematuria: Secondary | ICD-10-CM

## 2022-03-17 DIAGNOSIS — K5730 Diverticulosis of large intestine without perforation or abscess without bleeding: Secondary | ICD-10-CM | POA: Diagnosis not present

## 2022-03-17 DIAGNOSIS — R109 Unspecified abdominal pain: Secondary | ICD-10-CM

## 2022-03-17 DIAGNOSIS — N390 Urinary tract infection, site not specified: Secondary | ICD-10-CM | POA: Diagnosis not present

## 2022-03-17 DIAGNOSIS — R1031 Right lower quadrant pain: Secondary | ICD-10-CM | POA: Insufficient documentation

## 2022-03-17 DIAGNOSIS — R319 Hematuria, unspecified: Secondary | ICD-10-CM | POA: Diagnosis not present

## 2022-03-17 MED ORDER — IOHEXOL 350 MG/ML SOLN
75.0000 mL | Freq: Once | INTRAVENOUS | Status: AC | PRN
  Administered 2022-03-17: 75 mL via INTRAVENOUS

## 2022-05-13 ENCOUNTER — Encounter: Payer: Self-pay | Admitting: Gastroenterology

## 2022-06-10 DIAGNOSIS — Z125 Encounter for screening for malignant neoplasm of prostate: Secondary | ICD-10-CM | POA: Diagnosis not present

## 2022-06-10 DIAGNOSIS — E785 Hyperlipidemia, unspecified: Secondary | ICD-10-CM | POA: Diagnosis not present

## 2022-06-10 DIAGNOSIS — R7989 Other specified abnormal findings of blood chemistry: Secondary | ICD-10-CM | POA: Diagnosis not present

## 2022-06-10 DIAGNOSIS — R7301 Impaired fasting glucose: Secondary | ICD-10-CM | POA: Diagnosis not present

## 2022-06-17 DIAGNOSIS — C4490 Unspecified malignant neoplasm of skin, unspecified: Secondary | ICD-10-CM | POA: Diagnosis not present

## 2022-06-17 DIAGNOSIS — R82998 Other abnormal findings in urine: Secondary | ICD-10-CM | POA: Diagnosis not present

## 2022-06-17 DIAGNOSIS — Z0000 Encounter for general adult medical examination without abnormal findings: Secondary | ICD-10-CM | POA: Diagnosis not present

## 2022-07-12 ENCOUNTER — Encounter: Payer: Self-pay | Admitting: Gastroenterology

## 2022-07-25 DIAGNOSIS — L821 Other seborrheic keratosis: Secondary | ICD-10-CM | POA: Diagnosis not present

## 2022-07-25 DIAGNOSIS — Z85828 Personal history of other malignant neoplasm of skin: Secondary | ICD-10-CM | POA: Diagnosis not present

## 2022-07-25 DIAGNOSIS — L814 Other melanin hyperpigmentation: Secondary | ICD-10-CM | POA: Diagnosis not present

## 2022-07-25 DIAGNOSIS — D225 Melanocytic nevi of trunk: Secondary | ICD-10-CM | POA: Diagnosis not present

## 2022-07-25 DIAGNOSIS — L570 Actinic keratosis: Secondary | ICD-10-CM | POA: Diagnosis not present

## 2022-08-17 ENCOUNTER — Ambulatory Visit: Payer: BC Managed Care – PPO | Admitting: *Deleted

## 2022-08-17 VITALS — Ht 70.5 in | Wt 162.0 lb

## 2022-08-17 DIAGNOSIS — Z86010 Personal history of colonic polyps: Secondary | ICD-10-CM

## 2022-08-17 MED ORDER — NA SULFATE-K SULFATE-MG SULF 17.5-3.13-1.6 GM/177ML PO SOLN: 1.0000 | Freq: Once | ORAL | 0 refills | Status: AC

## 2022-08-17 NOTE — Progress Notes (Signed)
Pt's name and DOB verified at the beginning of the pre-visit.  Pt denies any difficulty with ambulating,sitting, laying down or rolling side to side Gave both [facility] main # and MD on call # prior to instructions.  No egg or soy allergy known to patient  No issues known to pt with past sedation with any surgeries or procedures Pt denies having issues being intubated Pt has no issues moving head neck or swallowing No FH of Malignant Hyperthermia Pt is not on diet pills Pt is not on home 02  Pt is not on blood thinners  Pt denies issues with constipation  Pt is not on dialysis Pt denise any abnormal heart rhythms  Pt denies any upcoming cardiac testing Pt encouraged to use to use Singlecare or Goodrx to reduce cost  Patient's chart reviewed by John Nulty CNRA prior to pre-visit and patient appropriate for the [facility].  Pre-visit completed and red dot placed by patient's name on their procedure day (on provider's schedule).  . Visit by phone Pt states weight is 162lb Instructed pt why it is important to and  to call if they have any changes in health or new medications. Directed them to the # given and on instructions.   Pt states they will.  Instructions reviewed with pt and pt states understanding. Instructed to review again prior to procedure. Pt states they will.  Instructions sent by mail with coupon and by my chart    

## 2022-08-23 ENCOUNTER — Encounter: Payer: Self-pay | Admitting: Gastroenterology

## 2022-09-06 ENCOUNTER — Encounter: Payer: Self-pay | Admitting: Gastroenterology

## 2022-09-06 ENCOUNTER — Ambulatory Visit (AMBULATORY_SURGERY_CENTER): Payer: BC Managed Care – PPO | Admitting: Gastroenterology

## 2022-09-06 VITALS — BP 110/71 | HR 61 | Temp 98.7°F | Resp 18 | Ht 70.0 in | Wt 162.0 lb

## 2022-09-06 DIAGNOSIS — Z09 Encounter for follow-up examination after completed treatment for conditions other than malignant neoplasm: Secondary | ICD-10-CM

## 2022-09-06 DIAGNOSIS — Z86010 Personal history of colonic polyps: Secondary | ICD-10-CM

## 2022-09-06 DIAGNOSIS — D123 Benign neoplasm of transverse colon: Secondary | ICD-10-CM

## 2022-09-06 DIAGNOSIS — D128 Benign neoplasm of rectum: Secondary | ICD-10-CM | POA: Diagnosis not present

## 2022-09-06 DIAGNOSIS — K635 Polyp of colon: Secondary | ICD-10-CM | POA: Diagnosis not present

## 2022-09-06 DIAGNOSIS — Z1211 Encounter for screening for malignant neoplasm of colon: Secondary | ICD-10-CM | POA: Diagnosis not present

## 2022-09-06 MED ORDER — SODIUM CHLORIDE 0.9 % IV SOLN: 500.0000 mL | INTRAVENOUS | Status: DC

## 2022-09-06 NOTE — Patient Instructions (Signed)
Please read handouts provided. Continue present medications. Await pathology results. High Fiber Diet. Use FiberCon 1-2 tablets daily.   YOU HAD AN ENDOSCOPIC PROCEDURE TODAY AT THE [facility]:   Refer to the procedure report that was given to you for any specific questions about what was found during the examination.  If the procedure report does not answer your questions, please call your gastroenterologist to clarify.  If you requested that your care partner not be given the details of your procedure findings, then the procedure report has been included in a sealed envelope for you to review at your convenience later.  YOU SHOULD EXPECT: Some feelings of bloating in the abdomen. Passage of more gas than usual.  Walking can help get rid of the air that was put into your GI tract during the procedure and reduce the bloating. If you had a lower endoscopy (such as a colonoscopy or flexible sigmoidoscopy) you may notice spotting of blood in your stool or on the toilet paper. If you underwent a bowel prep for your procedure, you may not have a normal bowel movement for a few days.  Please Note:  You might notice some irritation and congestion in your nose or some drainage.  This is from the oxygen used during your procedure.  There is no need for concern and it should clear up in a day or so.  SYMPTOMS TO REPORT IMMEDIATELY:  Following lower endoscopy (colonoscopy or flexible sigmoidoscopy):  Excessive amounts of blood in the stool  Significant tenderness or worsening of abdominal pains  Swelling of the abdomen that is new, acute  Fever of 100F or higher   For urgent or emergent issues, a gastroenterologist can be reached at any hour by calling [phone]. Do not use MyChart messaging for urgent concerns.    DIET:  We do recommend a small meal at first, but then you may proceed to your regular diet.  Drink plenty of fluids but you should avoid alcoholic beverages for 24  hours.  ACTIVITY:  You should plan to take it easy for the rest of today and you should NOT DRIVE or use heavy machinery until tomorrow (because of the sedation medicines used during the test).    FOLLOW UP: Our staff will call the number listed on your records the next business day following your procedure.  We will call around 7:15- 8:00 am to check on you and address any questions or concerns that you may have regarding the information given to you following your procedure. If we do not reach you, we will leave a message.     If any biopsies were taken you will be contacted by phone or by letter within the next 1-3 weeks.  Please call us at [phone] if you have not heard about the biopsies in 3 weeks.    SIGNATURES/CONFIDENTIALITY: You and/or your care partner have signed paperwork which will be entered into your electronic medical record.  These signatures attest to the fact that that the information above on your After Visit Summary has been reviewed and is understood.  Full responsibility of the confidentiality of this discharge information lies with you and/or your care-partner. 

## 2022-09-06 NOTE — Progress Notes (Signed)
GASTROENTEROLOGY PROCEDURE H&P NOTE   Primary Care Physician: Rodrigo Ran, MD  HPI: Roy Wyatt is a 67 y.o. male who presents for Colonoscopy for surveillance of previous adenomas.  Past Medical History:  Diagnosis Date   Arthritis    Cancer (HCC)    melanoma face   Cyst    left leg   Diverticulosis of colon    DJD (degenerative joint disease)    History of colonic polyps    History of pneumonia    History of skin cancer    on face   Hyperlipidemia    Lumbar back pain    Nasal congestion    Past Surgical History:  Procedure Laterality Date   cartilage repair  2000 - approx   knee   COLONOSCOPY     CYST EXCISION     on left leg   Current Outpatient Medications  Medication Sig Dispense Refill   Boswellia-Glucosamine-Vit D (GLUCOSAMINE COMPLEX PO) Take by mouth. Take two daily     simvastatin (ZOCOR) 40 MG tablet Take 40 mg by mouth daily.     aspirin 81 MG tablet Take 81 mg by mouth daily. (Patient not taking: Reported on 08/17/2022)     simvastatin (ZOCOR) 40 MG tablet Take 1 tablet (40 mg total) by mouth at bedtime. 90 tablet 1   Current Facility-Administered Medications  Medication Dose Route Frequency Provider Last Rate Last Admin   0.9 %  sodium chloride infusion  500 mL Intravenous Once Rachael Fee, MD       0.9 %  sodium chloride infusion  500 mL Intravenous Continuous Mansouraty, Netty Starring., MD        Current Outpatient Medications:    Boswellia-Glucosamine-Vit D (GLUCOSAMINE COMPLEX PO), Take by mouth. Take two daily, Disp: , Rfl:    simvastatin (ZOCOR) 40 MG tablet, Take 40 mg by mouth daily., Disp: , Rfl:    aspirin 81 MG tablet, Take 81 mg by mouth daily. (Patient not taking: Reported on 08/17/2022), Disp: , Rfl:    simvastatin (ZOCOR) 40 MG tablet, Take 1 tablet (40 mg total) by mouth at bedtime., Disp: 90 tablet, Rfl: 1  Current Facility-Administered Medications:    0.9 %  sodium chloride infusion, 500 mL, Intravenous, Once, Rachael Fee, MD   0.9 %  sodium chloride infusion, 500 mL, Intravenous, Continuous, Mansouraty, Netty Starring., MD No Known Allergies Family History  Problem Relation Age of Onset   Cancer Mother        liver, skin   Cancer Father        skin   Colon cancer Neg Hx    Esophageal cancer Neg Hx    Rectal cancer Neg Hx    Stomach cancer Neg Hx    Colon polyps Neg Hx    Social History   Socioeconomic History   Marital status: Married    Spouse name: Not on file   Number of children: Not on file   Years of education: Not on file   Highest education level: Not on file  Occupational History   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    Employer: [employer]  Tobacco Use   Smoking status: Never   Smokeless tobacco: Never  Vaping Use   Vaping status: Never Used  Substance and Sexual Activity   Alcohol use: Yes    Alcohol/week: 3.0 - 5.0 standard drinks of alcohol    Types: 3 - 5 Standard drinks or equivalent per week    Comment: 3 -  4 per week   Drug use: No   Sexual activity: Not on file  Other Topics Concern   Not on file  Social History Narrative   Not on file   Social Determinants of Health   Financial Resource Strain: Not on file  Food Insecurity: Not on file  Transportation Needs: Not on file  Physical Activity: Not on file  Stress: Not on file  Social Connections: Not on file  Intimate Partner Violence: Not on file    Physical Exam: Today's Vitals   09/06/22 0809  BP: 120/65  Pulse: 66  Temp: 98.7 F (37.1 C)  TempSrc: Temporal  SpO2: 99%  Weight: 162 lb (73.5 kg)  Height: 5\' 10"  (1.778 m)   Body mass index is 23.24 kg/m. GEN: NAD EYE: Sclerae anicteric ENT: MMM CV: Non-tachycardic GI: Soft, NT/ND NEURO:  Alert & Oriented x 3  Lab Results: No results for input(s): "WBC", "HGB", "HCT", "PLT" in the last 72 hours. BMET No results for input(s): "NA", "K", "CL", "CO2", "GLUCOSE", "BUN", "CREATININE", "CALCIUM" in the last 72 hours. LFT No results for input(s): "PROT",  "ALBUMIN", "AST", "ALT", "ALKPHOS", "BILITOT", "BILIDIR", "IBILI" in the last 72 hours. PT/INR No results for input(s): "LABPROT", "INR" in the last 72 hours.   Impression / Plan: This is a 67 y.o.male who presents for Colonoscopy for surveillance of previous adenomas.  The risks and benefits of endoscopic evaluation/treatment were discussed with the patient and/or family; these include but are not limited to the risk of perforation, infection, bleeding, missed lesions, lack of diagnosis, severe illness requiring hospitalization, as well as anesthesia and sedation related illnesses.  The patient's history has been reviewed, patient examined, no change in status, and deemed stable for procedure.  The patient and/or family is agreeable to proceed.    Corliss Parish, MD [facility]oscopy Office # [phone]

## 2022-09-06 NOTE — Progress Notes (Signed)
Called to room to assist during endoscopic procedure.  Patient ID and intended procedure confirmed with present staff. Received instructions for my participation in the procedure from the performing physician.  

## 2022-09-06 NOTE — Op Note (Signed)
[facility] Patient Name: Roy Wyatt Procedure Date: 09/06/2022 8:40 AM MRN: [id number] Endoscopist: Corliss Parish , MD, [id number] Age: 67 Referring MD:  Date of Birth: [birth year] Gender: Male Account #: [id number] Procedure:                Colonoscopy Indications:              Surveillance: Personal history of adenomatous                            polyps on last colonoscopy 5 years ago Medicines:                Monitored Anesthesia Care Procedure:                Pre-Anesthesia Assessment:                           - Prior to the procedure, a History and Physical                            was performed, and patient medications and                            allergies were reviewed. The patient's tolerance of                            previous anesthesia was also reviewed. The risks                            and benefits of the procedure and the sedation                            options and risks were discussed with the patient.                            All questions were answered, and informed consent                            was obtained. Prior Anticoagulants: The patient has                            taken no anticoagulant or antiplatelet agents                            except for aspirin. ASA Grade Assessment: II - A                            patient with mild systemic disease. After reviewing                            the risks and benefits, the patient was deemed in                            satisfactory condition to undergo the procedure.  After obtaining informed consent, the colonoscope                            was passed under direct vision. Throughout the                            procedure, the patient's blood pressure, pulse, and                            oxygen saturations were monitored continuously. The                            Olympus Scope SN: [id number] was introduced through                             the anus and advanced to the 3 cm into the ileum.                            The colonoscopy was performed without difficulty.                            The patient tolerated the procedure. The quality of                            the bowel preparation was good. The terminal ileum,                            ileocecal valve, appendiceal orifice, and rectum                            were photographed. Scope In: 8:57:00 AM Scope Out: 9:12:04 AM Scope Withdrawal Time: 0 hours 12 minutes 43 seconds  Total Procedure Duration: 0 hours 15 minutes 4 seconds  Findings:                 Skin tags were found on perianal exam.                           The digital rectal exam findings include                            hemorrhoids. Pertinent negatives include no                            palpable rectal lesions.                           The terminal ileum and ileocecal valve appeared                            normal.                           Two sessile polyps were found in the rectum and  transverse colon. The polyps were 3 to 8 mm in                            size. These polyps were removed with a cold snare.                            Resection and retrieval were complete.                           Multiple small-mouthed diverticula were found in                            the recto-sigmoid colon and sigmoid colon.                           Normal mucosa was found in the entire colon                            otherwise.                           Non-bleeding non-thrombosed internal hemorrhoids                            were found during retroflexion, during perianal                            exam and during digital exam. The hemorrhoids were                            Grade II (internal hemorrhoids that prolapse but                            reduce spontaneously). Complications:            No immediate complications. Estimated Blood Loss:     Estimated blood  loss was minimal. Impression:               - Perianal skin tags found on perianal exam.                           - Hemorrhoids found on digital rectal exam.                           - The examined portion of the ileum was normal.                           - Two 3 to 8 mm polyps in the rectum and in the                            transverse colon, removed with a cold snare.                            Resected and retrieved.                           -  Diverticulosis in the recto-sigmoid colon and in                            the sigmoid colon.                           - Normal mucosa in the entire examined colon                            otherwise.                           - Non-bleeding non-thrombosed internal hemorrhoids. Recommendation:           - The patient will be observed post-procedure,                            until all discharge criteria are met.                           - Discharge patient to home.                           - Patient has a contact number available for                            emergencies. The signs and symptoms of potential                            delayed complications were discussed with the                            patient. Return to normal activities tomorrow.                            Written discharge instructions were provided to the                            patient.                           - High fiber diet.                           - Use FiberCon 1-2 tablets PO daily.                           - Continue present medications.                           - Await pathology results.                           - Repeat colonoscopy in 5-7 years for surveillance                            based on final pathology.                           -  The findings and recommendations were discussed                            with the patient.                           - The findings and recommendations were discussed                            with  the patient's family. Corliss Parish, MD 09/06/2022 9:18:08 AM

## 2022-09-06 NOTE — Progress Notes (Signed)
Uneventful anesthetic. Report to pacu rn. Vss. Care resumed by rn. 

## 2022-09-07 ENCOUNTER — Telehealth: Payer: Self-pay

## 2022-09-07 NOTE — Telephone Encounter (Signed)
  Follow up Call-     09/06/2022    8:10 AM  Call back number  Post procedure Call Back phone  # [phone]  Permission to leave phone message Yes     Patient questions:  Do you have a fever, pain , or abdominal swelling? No. Pain Score  0 *  Have you tolerated food without any problems? Yes.    Have you been able to return to your normal activities? Yes.    Do you have any questions about your discharge instructions: Diet   No. Medications  No. Follow up visit  No.  Do you have questions or concerns about your Care? No.  Actions: * If pain score is 4 or above: No action needed, pain <4.

## 2022-09-14 ENCOUNTER — Encounter: Payer: Self-pay | Admitting: Gastroenterology

## 2023-09-14 DIAGNOSIS — L820 Inflamed seborrheic keratosis: Secondary | ICD-10-CM | POA: Diagnosis not present

## 2023-09-18 DIAGNOSIS — Z1389 Encounter for screening for other disorder: Secondary | ICD-10-CM | POA: Diagnosis not present

## 2023-09-18 DIAGNOSIS — E785 Hyperlipidemia, unspecified: Secondary | ICD-10-CM | POA: Diagnosis not present

## 2023-09-18 DIAGNOSIS — R7301 Impaired fasting glucose: Secondary | ICD-10-CM | POA: Diagnosis not present

## 2023-09-18 DIAGNOSIS — Z1212 Encounter for screening for malignant neoplasm of rectum: Secondary | ICD-10-CM | POA: Diagnosis not present

## 2023-09-25 DIAGNOSIS — Z1331 Encounter for screening for depression: Secondary | ICD-10-CM | POA: Diagnosis not present

## 2023-09-25 DIAGNOSIS — Z23 Encounter for immunization: Secondary | ICD-10-CM | POA: Diagnosis not present

## 2023-09-25 DIAGNOSIS — R82998 Other abnormal findings in urine: Secondary | ICD-10-CM | POA: Diagnosis not present

## 2023-09-25 DIAGNOSIS — Z0000 Encounter for general adult medical examination without abnormal findings: Secondary | ICD-10-CM | POA: Diagnosis not present

## 2023-09-25 DIAGNOSIS — C4490 Unspecified malignant neoplasm of skin, unspecified: Secondary | ICD-10-CM | POA: Diagnosis not present

## 2023-09-28 ENCOUNTER — Other Ambulatory Visit (HOSPITAL_COMMUNITY): Payer: Self-pay | Admitting: Internal Medicine

## 2023-09-28 DIAGNOSIS — R011 Cardiac murmur, unspecified: Secondary | ICD-10-CM

## 2023-11-06 ENCOUNTER — Ambulatory Visit (HOSPITAL_COMMUNITY)
Admission: RE | Admit: 2023-11-06 | Discharge: 2023-11-06 | Disposition: A | Discharge status: Home / Self Care | Source: Ambulatory Visit | Attending: Cardiology | Admitting: Cardiology

## 2023-11-06 DIAGNOSIS — R011 Cardiac murmur, unspecified: Secondary | ICD-10-CM | POA: Insufficient documentation

## 2023-11-08 LAB — ECHOCARDIOGRAM COMPLETE
AR max vel: 2.66 cm2
AV Area VTI: 2.54 cm2
AV Area mean vel: 2.29 cm2
AV Mean grad: 5.1 mmHg
AV Peak grad: 9.3 mmHg
Ao pk vel: 1.52 m/s
Area-P 1/2: 3.1 cm2
S' Lateral: 3.1 cm
